# Patient Record
Sex: Female | Born: 1965 | Race: White | Hispanic: No | Marital: Married | Smoking: Never smoker
Health system: Southern US, Community
[De-identification: ages and names within clinical notes are randomized; demographics above are authoritative.]

## PROBLEM LIST (undated history)

## (undated) DIAGNOSIS — F329 Major depressive disorder, single episode, unspecified: Secondary | ICD-10-CM

## (undated) DIAGNOSIS — F419 Anxiety disorder, unspecified: Secondary | ICD-10-CM

## (undated) DIAGNOSIS — O24419 Gestational diabetes mellitus in pregnancy, unspecified control: Secondary | ICD-10-CM

## (undated) DIAGNOSIS — F32A Depression, unspecified: Secondary | ICD-10-CM

## (undated) DIAGNOSIS — L280 Lichen simplex chronicus: Secondary | ICD-10-CM

## (undated) HISTORY — DX: Lichen simplex chronicus: L28.0

## (undated) HISTORY — PX: NO PAST SURGERIES: SHX2092

## (undated) HISTORY — DX: Anxiety disorder, unspecified: F41.9

## (undated) HISTORY — DX: Gestational diabetes mellitus in pregnancy, unspecified control: O24.419

## (undated) HISTORY — DX: Depression, unspecified: F32.A

---
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
## 1998-06-17 ENCOUNTER — Other Ambulatory Visit: Admission: RE | Admit: 1998-06-17 | Discharge: 1998-06-17 | Payer: Self-pay | Admitting: Obstetrics and Gynecology

## 1999-06-23 ENCOUNTER — Other Ambulatory Visit: Admission: RE | Admit: 1999-06-23 | Discharge: 1999-06-23 | Payer: Self-pay | Admitting: Obstetrics and Gynecology

## 2000-01-01 ENCOUNTER — Encounter: Admission: RE | Admit: 2000-01-01 | Discharge: 2000-01-01 | Payer: Self-pay | Admitting: Otolaryngology

## 2000-01-01 ENCOUNTER — Encounter: Payer: Self-pay | Admitting: Obstetrics and Gynecology

## 2000-07-05 ENCOUNTER — Other Ambulatory Visit: Admission: RE | Admit: 2000-07-05 | Discharge: 2000-07-05 | Payer: Self-pay | Admitting: Obstetrics and Gynecology

## 2001-08-08 ENCOUNTER — Other Ambulatory Visit: Admission: RE | Admit: 2001-08-08 | Discharge: 2001-08-08 | Payer: Self-pay | Admitting: Obstetrics and Gynecology

## 2002-08-21 ENCOUNTER — Other Ambulatory Visit: Admission: RE | Admit: 2002-08-21 | Discharge: 2002-08-21 | Payer: Self-pay | Admitting: Obstetrics and Gynecology

## 2003-09-17 ENCOUNTER — Other Ambulatory Visit: Admission: RE | Admit: 2003-09-17 | Discharge: 2003-09-17 | Payer: Self-pay | Admitting: Obstetrics and Gynecology

## 2004-12-09 ENCOUNTER — Other Ambulatory Visit: Admission: RE | Admit: 2004-12-09 | Discharge: 2004-12-09 | Payer: Self-pay | Admitting: Obstetrics and Gynecology

## 2005-11-01 ENCOUNTER — Encounter: Admission: RE | Admit: 2005-11-01 | Discharge: 2005-11-01 | Payer: Self-pay | Admitting: Obstetrics and Gynecology

## 2005-12-15 ENCOUNTER — Other Ambulatory Visit: Admission: RE | Admit: 2005-12-15 | Discharge: 2005-12-15 | Payer: Self-pay | Admitting: Obstetrics and Gynecology

## 2006-11-04 ENCOUNTER — Encounter: Admission: RE | Admit: 2006-11-04 | Discharge: 2006-11-04 | Payer: Self-pay | Admitting: Obstetrics and Gynecology

## 2006-12-07 ENCOUNTER — Other Ambulatory Visit: Admission: RE | Admit: 2006-12-07 | Discharge: 2006-12-07 | Payer: Self-pay | Admitting: Obstetrics and Gynecology

## 2007-11-06 ENCOUNTER — Encounter: Admission: RE | Admit: 2007-11-06 | Discharge: 2007-11-06 | Payer: Self-pay | Admitting: Obstetrics and Gynecology

## 2008-01-29 ENCOUNTER — Other Ambulatory Visit: Admission: RE | Admit: 2008-01-29 | Discharge: 2008-01-29 | Payer: Self-pay | Admitting: Obstetrics and Gynecology

## 2008-11-11 ENCOUNTER — Encounter: Admission: RE | Admit: 2008-11-11 | Discharge: 2008-11-11 | Payer: Self-pay | Admitting: Obstetrics and Gynecology

## 2008-11-15 ENCOUNTER — Encounter: Admission: RE | Admit: 2008-11-15 | Discharge: 2008-11-15 | Payer: Self-pay | Admitting: Obstetrics and Gynecology

## 2009-01-29 ENCOUNTER — Ambulatory Visit: Payer: Self-pay | Admitting: Family Medicine

## 2009-01-29 DIAGNOSIS — R21 Rash and other nonspecific skin eruption: Secondary | ICD-10-CM | POA: Insufficient documentation

## 2009-02-03 ENCOUNTER — Telehealth: Payer: Self-pay | Admitting: Family Medicine

## 2009-09-14 ENCOUNTER — Ambulatory Visit: Payer: Self-pay | Admitting: Occupational Medicine

## 2009-11-17 ENCOUNTER — Encounter: Admission: RE | Admit: 2009-11-17 | Discharge: 2009-11-17 | Payer: Self-pay | Admitting: Obstetrics and Gynecology

## 2010-05-17 ENCOUNTER — Encounter: Payer: Self-pay | Admitting: Obstetrics and Gynecology

## 2010-05-26 NOTE — Assessment & Plan Note (Signed)
Summary: Cough-runny nose - clear, sinus pressure, sorethroat x3 dys rm 3   Vital Signs:  Patient Profile:   45 Years Old Female CC:      Cold & URI symptoms Height:     67 inches Weight:      127 pounds O2 Sat:      99 % O2 treatment:    Room Air Temp:     98.0 degrees F oral Pulse rate:   89 / minute Pulse rhythm:   regular Resp:     16 per minute BP sitting:   101 / 67  (right arm) Cuff size:   regular  Vitals Entered By: Areta Haber CMA (Sep 14, 2009 1:15 PM)                  Current Allergies: ! MACROBID ! BETADINE (POVIDONE-IODINE)      History of Present Illness Chief Complaint: Cold & URI symptoms History of Present Illness: Very pleasant kindergarten teacher.  Presents with a three day history of fatigue and flu like symptoms.   She has had generalized body aches and chills.  Yesterday she developed sore throat.  Throat is so sore that she can't swallow own saliva.  No reports of shortness of breath, stridor, or difficulty breathing.  No reports of sinus congestion or ear pain.   Rapid strep test was positive.   Current Problems: STREP THROAT (ICD-034.0) SKIN RASH (ICD-782.1)   Current Meds YASMIN 28 3-0.03 MG TABS (DROSPIRENONE-ETHINYL ESTRADIOL) Take 1 tab by mouth once daily * B COMPLEX  * CALCIUM  * WOMEN'S MV  * FISH OIL  NYQUIL D COLD/FLU 60-12.09-22-998 MG/30ML LIQD (PSEUDOEPH-DOXYLAMINE-DM-APAP) as directed ADVIL 200 MG TABS (IBUPROFEN) as directed QC SEVERE ALLERGY 12.5-500 MG TABS (DIPHENHYDRAMINE-APAP) as directed LIDOCAINE VISCOUS 2 % SOLN (LIDOCAINE HCL) 15ml by mouth q3 to 4hr as needed.  Swish and spit out.  Max 8 doses/day LORTAB 5 5-500 MG TABS (HYDROCODONE-ACETAMINOPHEN) One or two tabs by mouth hs as needed pain  REVIEW OF SYSTEMS Constitutional Symptoms       Complains of fever.     Denies chills, night sweats, weight loss, weight gain, and fatigue.  Eyes       Denies change in vision, eye pain, eye discharge, glasses,  contact lenses, and eye surgery. Ear/Nose/Throat/Mouth       Complains of frequent runny nose, sinus problems, and sore throat.      Denies hearing loss/aids, change in hearing, ear pain, ear discharge, dizziness, frequent nose bleeds, hoarseness, and tooth pain or bleeding.      Comments: x 3 dys  Respiratory       Complains of dry cough.      Denies productive cough, wheezing, shortness of breath, asthma, bronchitis, and emphysema/COPD.  Cardiovascular       Denies murmurs, chest pain, and tires easily with exhertion.    Gastrointestinal       Denies stomach pain, nausea/vomiting, diarrhea, constipation, blood in bowel movements, and indigestion. Genitourniary       Denies painful urination, kidney stones, and loss of urinary control. Neurological       Complains of headaches.      Denies paralysis, seizures, and fainting/blackouts. Musculoskeletal       Denies muscle pain, joint pain, joint stiffness, decreased range of motion, redness, swelling, muscle weakness, and gout.  Skin       Denies bruising, unusual mles/lumps or sores, and hair/skin or nail changes.  Psych  Denies mood changes, temper/anger issues, anxiety/stress, speech problems, depression, and sleep problems. Other Comments: Pt has not seen PCP for this.   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  Married to [employer] with 2 kids.  Never Smoked Alcohol use-no Drug use-no  Risk Factors: Alcohol Use: 0 (01/29/2009)  Risk Factors: Smoking Status: never (01/29/2009) Physical Exam General appearance: well developed, well nourished, no acute distress Ears: normal, no lesions or deformities Nasal: mucosa pink, nonedematous, no septal deviation, turbinates normal Oral/Pharynx:  pharyngeal erythema without exudate, uvula midline without deviation Neck: supple,anterior lymphadenopathy present Chest/Lungs: no rales, wheezes, or rhonchi bilateral, breath sounds equal without effort Heart: regular rate and  rhythm, no murmur Assessment New Problems: STREP THROAT (ICD-034.0)   Plan New Medications/Changes: LORTAB 5 5-500 MG TABS (HYDROCODONE-ACETAMINOPHEN) One or two tabs by mouth hs as needed pain  #10 (ten) x 0, 09/14/2009, Kathrine Haddock MD LIDOCAINE VISCOUS 2 % SOLN (LIDOCAINE HCL) 15ml by mouth q3 to 4hr as needed.  Swish and spit out.  Max 8 doses/day  #200cc x 0, 09/14/2009, Kathrine Haddock MD  New Orders: New Patient Level III [phone] Bicillin LA 1.2 million units Injection [J0561] Admin of Therapeutic Inj  intramuscular or subcutaneous [96372] Planning Comments:   Gave 1.2 million units Biacillin IM Viscous lidocaine script Vicodin for pain tonight if needed Advised to seek emergency care if she develops difficulty breathing, or stridor.  Rapid strep was positive.   The patient and/or caregiver has been counseled thoroughly with regard to medications prescribed including dosage, schedule, interactions, rationale for use, and possible side effects and they verbalize understanding.  Diagnoses and expected course of recovery discussed and will return if not improved as expected or if the condition worsens. Patient and/or caregiver verbalized understanding.  Prescriptions: LORTAB 5 5-500 MG TABS (HYDROCODONE-ACETAMINOPHEN) One or two tabs by mouth hs as needed pain  #10 (ten) x 0   Entered and Authorized by:   Kathrine Haddock MD   Signed by:   Kathrine Haddock MD on 09/14/2009   Method used:   Print then Give to Patient   RxID:   [id number] LIDOCAINE VISCOUS 2 % SOLN (LIDOCAINE HCL) 15ml by mouth q3 to 4hr as needed.  Swish and spit out.  Max 8 doses/day  #200cc x 0   Entered and Authorized by:   Kathrine Haddock MD   Signed by:   Kathrine Haddock MD on  09/14/2009   Method used:   Electronically to        [facility] [phone]* (retail)       [address]       [place], [place]  [address]       Ph: [phone]       Fax: [phone]   RxID:   [id number]   Laboratory Results  Date/Time Received: Sep 14, 2009 1:43 PM  Date/Time Reported: Sep 14, 2009 1:43 PM   Other Tests  Rapid Strep: positive  Kit Test Internal QC: Positive   (Normal Range: Negative)    Medication Administration  Injection # 1:    Medication: Bicillin LA 1.2 million units Injection    Diagnosis: STREP THROAT (ICD-034.0)    Route: IM  Site: LUOQ gluteus    Exp Date: 04/25/2012    Lot #: [id number]    Mfr: Brooke Dare    Patient tolerated injection without complications    Given by: Areta Haber CMA (Sep 14, 2009 2:01 PM)  Orders Added: 1)  New Patient Level III [99203] 2)  Bicillin LA 1.2 million units Injection [J0561] 3)  Admin of Therapeutic Inj  intramuscular or subcutaneous [96372]   Appended Document: Cough-runny nose - clear, sinus pressure, sorethroat x3 dys rm 3 Rx for Lidocaine 2% Solution was electronically sent to wrong pharmacy, pt. was at [facility], informed pharmacists of RX and CX'd one at [facility]. Pt informed. {Teresa MOntgomery, CMA Sep 14, 2009 5:00pm.

## 2010-10-15 ENCOUNTER — Other Ambulatory Visit: Payer: Self-pay | Admitting: Obstetrics and Gynecology

## 2010-10-15 DIAGNOSIS — Z1231 Encounter for screening mammogram for malignant neoplasm of breast: Secondary | ICD-10-CM

## 2010-11-24 ENCOUNTER — Ambulatory Visit
Admission: RE | Admit: 2010-11-24 | Discharge: 2010-11-24 | Disposition: A | Discharge status: Home / Self Care | Payer: Self-pay | Source: Ambulatory Visit | Attending: Obstetrics and Gynecology | Admitting: Obstetrics and Gynecology

## 2010-11-24 DIAGNOSIS — Z1231 Encounter for screening mammogram for malignant neoplasm of breast: Secondary | ICD-10-CM

## 2011-12-07 ENCOUNTER — Other Ambulatory Visit: Payer: Self-pay | Admitting: Obstetrics and Gynecology

## 2011-12-07 DIAGNOSIS — Z1231 Encounter for screening mammogram for malignant neoplasm of breast: Secondary | ICD-10-CM

## 2011-12-14 ENCOUNTER — Ambulatory Visit (INDEPENDENT_AMBULATORY_CARE_PROVIDER_SITE_OTHER): Payer: PRIVATE HEALTH INSURANCE

## 2011-12-14 DIAGNOSIS — Z1231 Encounter for screening mammogram for malignant neoplasm of breast: Secondary | ICD-10-CM

## 2012-11-27 ENCOUNTER — Other Ambulatory Visit: Payer: Self-pay | Admitting: Obstetrics & Gynecology

## 2012-11-27 DIAGNOSIS — Z1231 Encounter for screening mammogram for malignant neoplasm of breast: Secondary | ICD-10-CM

## 2012-12-07 ENCOUNTER — Ambulatory Visit (INDEPENDENT_AMBULATORY_CARE_PROVIDER_SITE_OTHER): Payer: Self-pay

## 2012-12-07 DIAGNOSIS — Z1231 Encounter for screening mammogram for malignant neoplasm of breast: Secondary | ICD-10-CM

## 2013-02-21 ENCOUNTER — Other Ambulatory Visit: Payer: Self-pay | Admitting: Obstetrics and Gynecology

## 2013-02-21 NOTE — Telephone Encounter (Signed)
eScribe request for refill on JUNEL Last filled - 03/15/12 X 1 YEAR Last AEX - 03/15/12 Next AEX - 03/27/13 RX sent until AEX.

## 2013-03-27 ENCOUNTER — Encounter: Payer: Self-pay | Admitting: Obstetrics & Gynecology

## 2013-03-27 ENCOUNTER — Ambulatory Visit: Payer: Self-pay | Admitting: Obstetrics and Gynecology

## 2013-03-27 ENCOUNTER — Ambulatory Visit (INDEPENDENT_AMBULATORY_CARE_PROVIDER_SITE_OTHER): Payer: Self-pay | Admitting: Obstetrics & Gynecology

## 2013-03-27 VITALS — BP 102/70 | HR 68 | Resp 16 | Ht 65.5 in | Wt 141.2 lb

## 2013-03-27 DIAGNOSIS — Z0000 Encounter for general adult medical examination without abnormal findings: Secondary | ICD-10-CM

## 2013-03-27 DIAGNOSIS — Z124 Encounter for screening for malignant neoplasm of cervix: Secondary | ICD-10-CM

## 2013-03-27 DIAGNOSIS — Z01419 Encounter for gynecological examination (general) (routine) without abnormal findings: Secondary | ICD-10-CM

## 2013-03-27 DIAGNOSIS — G43909 Migraine, unspecified, not intractable, without status migrainosus: Secondary | ICD-10-CM

## 2013-03-27 LAB — POCT URINALYSIS DIPSTICK
Blood, UA: NEGATIVE
Protein, UA: NEGATIVE
Urobilinogen, UA: NEGATIVE
pH, UA: 5

## 2013-03-27 LAB — HEMOGLOBIN, FINGERSTICK: Hemoglobin, fingerstick: 14.1 g/dL (ref 12.0–16.0)

## 2013-03-27 MED ORDER — NORETHINDRONE ACET-ETHINYL EST 1-20 MG-MCG PO TABS: 1.0000 | ORAL_TABLET | Freq: Every day | ORAL | Status: DC

## 2013-03-27 MED ORDER — SUMATRIPTAN-NAPROXEN SODIUM 85-500 MG PO TABS: 1.0000 | ORAL_TABLET | ORAL | Status: DC | PRN

## 2013-03-27 NOTE — Progress Notes (Signed)
47 y.o. G4P2 MarriedCaucasianF here for annual exam.  Went to the ER--[facility]--11/ due to severe headache.  Treated with butalbital/Caffeine/Acetaminophen.  Has never been on a migraine medication.  Does not have headaches frequently.  Would like something on hand if needed.  Had CT of head which was normal and blood work that was normal.  She was in ER for "hours".  Doesn't feel like the RX given totally helps.  Would like another suggestion.  Has friend that uses Treximet.  Never tried and ergotamine in past.  Does not feel these are menstrual related at all.  For her, they are very stress related.  Patient's last menstrual period was 03/09/2013.          Sexually active: yes  The current method of family planning is OCP (estrogen/progesterone).    Exercising: yes  walking Smoker:  no  Health Maintenance: Pap:  02/20/10 WNL History of abnormal Pap:  yes MMG:  12/07/12 normal Colonoscopy:  none BMD:   none TDaP:  10/11 Screening Labs: cholesterol 2011, Hb today: 14.1, Urine today: WBC-1+   reports that she has never smoked. She has never used smokeless tobacco. She reports that she does not drink alcohol or use illicit drugs.  Past Medical History  Diagnosis Date  . Lichen simplex chronicus   . Gestational diabetes     History reviewed. No pertinent past surgical history.  Current Outpatient Prescriptions  Medication Sig Dispense Refill  . Ascorbic Acid (VITAMIN C PO) Take by mouth daily.      . Calcium Carbonate (CALCIUM 600 PO) Take by mouth daily.      . Cyanocobalamin (VITAMIN B 12 PO) Take 1,000 mg by mouth daily.      Colleen Can 1/20 1-20 MG-MCG tablet TAKE 1 TABLET EVERY DAY  21 tablet  1  . loratadine (CLARITIN) 10 MG tablet Take 10 mg by mouth daily as needed for allergies.      . Magnesium 250 MG TABS Take by mouth daily.      . Multiple Vitamins-Minerals (MULTIVITAMIN PO) Take by mouth daily.      . Omega-3 Fatty Acids (FISH OIL PO) Take by mouth daily.        No current facility-administered medications for this visit.    Family History  Problem Relation Age of Onset  . Deep vein thrombosis Mother     on HRT  . Breast cancer Maternal Grandmother     post menopausal    ROS:  Pertinent items are noted in HPI.  Otherwise, a comprehensive ROS was negative.  Exam:   BP 102/70  Pulse 68  Resp 16  Ht 5' 5.5" (1.664 m)  Wt 141 lb 3.2 oz (64.048 kg)  BMI 23.13 kg/m2  LMP 03/09/2013  Weight change: +3lbs  Height: 5' 5.5" (166.4 cm)  Ht Readings from Last 3 Encounters:  03/27/13 5' 5.5" (1.664 m)  01/29/09 5\' 7"  (1.702 m)    General appearance: alert, cooperative and appears stated age Head: Normocephalic, without obvious abnormality, atraumatic Neck: no adenopathy, supple, symmetrical, trachea midline and thyroid normal to inspection and palpation Lungs: clear to auscultation bilaterally Breasts: normal appearance, no masses or tenderness Heart: regular rate and rhythm Abdomen: soft, non-tender; bowel sounds normal; no masses,  no organomegaly Extremities: extremities normal, atraumatic, no cyanosis or edema Skin: Skin color, texture, turgor normal. No rashes or lesions Lymph nodes: Cervical, supraclavicular, and axillary nodes normal. No abnormal inguinal nodes palpated Neurologic: Grossly normal   Pelvic:  External genitalia:  no lesions              Urethra:  normal appearing urethra with no masses, tenderness or lesions              Bartholins and Skenes: normal                 Vagina: normal appearing vagina with normal color and discharge, no lesions              Cervix: no lesions              Pap taken: yes Bimanual Exam:  Uterus:  normal size, contour, position, consistency, mobility, non-tender              Adnexa: normal adnexa and no mass, fullness, tenderness               Rectovaginal: Confirms               Anus:  normal sphincter tone, no lesions  A:  Well Woman with normal exam Migraines On OCPs  P:    Mammogram yearly pap smear with + HR HPV testing obtained today Rx for LoOvral to pharmacy for yr. Trial of Treximet 1 tab with HA onset, can repeat in 2 hrs.  Pt aware I can also prescribed Imitrex and Naprosyn, if cheaper for her.  She will call pharmacy and see about cost and let me know. return annually or prn  An After Visit Summary was printed and given to the patient.

## 2013-03-27 NOTE — Patient Instructions (Signed)

## 2013-03-28 ENCOUNTER — Telehealth: Payer: Self-pay | Admitting: Obstetrics & Gynecology

## 2013-03-28 NOTE — Addendum Note (Signed)
Addended by: Jerene Bears on: 03/28/2013 06:14 AM   Modules accepted: Orders

## 2013-03-28 NOTE — Telephone Encounter (Signed)
Patient went to pick medication listed below she doesn't have insurance it is going to cost her over $600 for 9 pills. She said miller told her to call if the price was out of her reach and she would rewrite the script for the generic brand   SUMAtriptan-naproxen (TREXIMET) 85-500 MG per tablet  Take 1 tablet by mouth every 2 (two) hours as needed for migraine., Starting 03/27/2013, Until Discontinued, Normal, Last Dose: Not Recorded  Refills: 6 ordered Pharmacy: [id number] - [place], [place] - [address]

## 2013-03-29 MED ORDER — NAPROXEN 500 MG PO TABS: 500.0000 mg | ORAL_TABLET | Freq: Two times a day (BID) | ORAL | Status: DC

## 2013-03-29 MED ORDER — SUMATRIPTAN SUCCINATE 100 MG PO TABS: 100.0000 mg | ORAL_TABLET | ORAL | Status: DC | PRN

## 2013-03-29 NOTE — Telephone Encounter (Signed)
Order done for imitrex 100mg  and naprosyn 500mg .  treximet is 85mg  and 500mg .  So with headache onset, she needs to take both and can repeat both in 2 hours.

## 2013-03-29 NOTE — Telephone Encounter (Signed)
Routing to Dr. Hyacinth Meeker for medication change.

## 2013-03-30 ENCOUNTER — Other Ambulatory Visit: Payer: Self-pay | Admitting: Obstetrics & Gynecology

## 2013-03-30 NOTE — Telephone Encounter (Signed)
Refills x 1 year were sent on 03/27/13. RX denied.

## 2013-03-30 NOTE — Telephone Encounter (Signed)
Message left to return call to [place] at [phone].   Imitrex called to pharmacy.  Routing to [place] just for fyi that I called immitrex to pharmacy.

## 2013-11-02 ENCOUNTER — Other Ambulatory Visit: Payer: Self-pay | Admitting: Obstetrics & Gynecology

## 2013-11-02 DIAGNOSIS — Z139 Encounter for screening, unspecified: Secondary | ICD-10-CM

## 2013-11-20 ENCOUNTER — Ambulatory Visit (INDEPENDENT_AMBULATORY_CARE_PROVIDER_SITE_OTHER): Payer: Self-pay

## 2013-11-20 DIAGNOSIS — Z139 Encounter for screening, unspecified: Secondary | ICD-10-CM

## 2013-11-20 DIAGNOSIS — Z1231 Encounter for screening mammogram for malignant neoplasm of breast: Secondary | ICD-10-CM

## 2014-02-15 ENCOUNTER — Emergency Department
Admission: EM | Admit: 2014-02-15 | Discharge: 2014-02-15 | Disposition: A | Discharge status: Home / Self Care | Payer: Self-pay | Source: Home / Self Care | Attending: Emergency Medicine | Admitting: Emergency Medicine

## 2014-02-15 ENCOUNTER — Encounter: Payer: Self-pay | Admitting: Emergency Medicine

## 2014-02-15 DIAGNOSIS — J020 Streptococcal pharyngitis: Secondary | ICD-10-CM

## 2014-02-15 LAB — POCT RAPID STREP A (OFFICE): Rapid Strep A Screen: NEGATIVE

## 2014-02-15 MED ORDER — POLYMYXIN B-TRIMETHOPRIM 10000-0.1 UNIT/ML-% OP SOLN
1.0000 [drp] | Freq: Four times a day (QID) | OPHTHALMIC | Status: DC
Start: 2014-02-15 — End: 2014-03-01

## 2014-02-15 MED ORDER — METHYLPREDNISOLONE ACETATE 80 MG/ML IJ SUSP: 80.0000 mg | Freq: Once | INTRAMUSCULAR | Status: DC

## 2014-02-15 MED ORDER — AMOXICILLIN 875 MG PO TABS: 875.0000 mg | ORAL_TABLET | Freq: Two times a day (BID) | ORAL | Status: DC

## 2014-02-15 MED ORDER — PREDNISONE (PAK) 10 MG PO TABS: ORAL_TABLET | Freq: Every day | ORAL | Status: DC

## 2014-02-15 NOTE — ED Provider Notes (Signed)
CSN: [id number]     Arrival date & time 02/15/14  1530 History   First MD Initiated Contact with Patient 02/15/14 1539     Chief Complaint  Patient presents with  . Sore Throat   (Consider location/radiation/quality/duration/timing/severity/associated sxs/prior Treatment) HPI Vanessa Chase is a 48 y.o. female who complains of onset of sore throat for 10 days.  The symptoms are constant and mild-moderate in severity.  She was diagnosed with a positive strep test about a week ago and took penicillin.  She is not feeling any better.  Also took viscous lidocaine.  She did not change her toothbrush.  She's also been experiencing left eye redness for the last 1 day.  Also some sinus pressure.  She is a Manufacturing systems engineerpreschool teacher.     Past Medical History  Diagnosis Date  . Lichen simplex chronicus   . Gestational diabetes    History reviewed. No pertinent past surgical history. Family History  Problem Relation Age of Onset  . Deep vein thrombosis Mother     on HRT  . Breast cancer Maternal Grandmother     post menopausal   History  Substance Use Topics  . Smoking status: Never Smoker   . Smokeless tobacco: Never Used  . Alcohol Use: No   OB History   Grav Para Term Preterm Abortions TAB SAB Ect Mult Living   4 2        2      Review of Systems  All other systems reviewed and are negative.   Allergies  Betadine; Nitrofurantoin; and Povidone-iodine  Home Medications   Prior to Admission medications   Medication Sig Start Date End Date Taking? Authorizing Provider  penicillin v potassium (VEETID) 500 MG tablet Take 500 mg by mouth 4 (four) times daily.   Yes Historical Provider, MD  amoxicillin (AMOXIL) 875 MG tablet Take 1 tablet (875 mg total) by mouth 2 (two) times daily. 02/15/14   Marlaine HindJeffrey H Alfonzo Arca, MD  Ascorbic Acid (VITAMIN C PO) Take by mouth daily.    Historical Provider, MD  Calcium Carbonate (CALCIUM 600 PO) Take by mouth daily.    Historical Provider, MD  Cyanocobalamin  (VITAMIN B 12 PO) Take 1,000 mg by mouth daily.    Historical Provider, MD  loratadine (CLARITIN) 10 MG tablet Take 10 mg by mouth daily as needed for allergies.    Historical Provider, MD  Magnesium 250 MG TABS Take by mouth daily.    Historical Provider, MD  Multiple Vitamins-Minerals (MULTIVITAMIN PO) Take by mouth daily.    Historical Provider, MD  naproxen (NAPROSYN) 500 MG tablet Take 1 tablet (500 mg total) by mouth 2 (two) times daily with a meal. 03/29/13   Annamaria BootsMary Suzanne Miller, MD  norethindrone-ethinyl estradiol (JUNEL 1/20) 1-20 MG-MCG tablet Take 1 tablet by mouth daily. 03/27/13   Annamaria BootsMary Suzanne Miller, MD  Omega-3 Fatty Acids (FISH OIL PO) Take by mouth daily.    Historical Provider, MD  predniSONE (STERAPRED UNI-PAK) 10 MG tablet Take by mouth daily. 6 day pack, use as directed 02/15/14   Marlaine HindJeffrey H Tennelle Taflinger, MD  SUMAtriptan (IMITREX) 100 MG tablet Take 1 tablet (100 mg total) by mouth every 2 (two) hours as needed for migraine or headache. May repeat in 2 hours if headache persists or recurs. 03/29/13   Annamaria BootsMary Suzanne Miller, MD  SUMAtriptan-naproxen (TREXIMET) 85-500 MG per tablet Take 1 tablet by mouth every 2 (two) hours as needed for migraine. 03/27/13   Annamaria BootsMary Suzanne Miller, MD  trimethoprim-polymyxin b (POLYTRIM)  ophthalmic solution Place 1 drop into the left eye every 6 (six) hours. 02/15/14   Marlaine HindJeffrey H Anandi Abramo, MD   BP 134/79  Pulse 92  Temp(Src) 98.3 F (36.8 C) (Oral)  Ht 5\' 7"  (1.702 m)  Wt 146 lb (66.225 kg)  BMI 22.86 kg/m2  SpO2 99%  LMP 02/01/2014 Physical Exam  Nursing note and vitals reviewed. Constitutional: She is oriented to person, place, and time. She appears well-developed and well-nourished.  HENT:  Head: Normocephalic and atraumatic.  Right Ear: Tympanic membrane, external ear and ear canal normal.  Left Ear: Tympanic membrane, external ear and ear canal normal.  Nose: Nose normal.  Mouth/Throat: Posterior oropharyngeal erythema present. No oropharyngeal  exudate or posterior oropharyngeal edema.  Eyes: EOM are normal. Pupils are equal, round, and reactive to light. Right eye exhibits no discharge. Left eye exhibits no discharge. Right conjunctiva is not injected. Left conjunctiva is injected. No scleral icterus.  Neck: Neck supple.  Cardiovascular: Regular rhythm and normal heart sounds.   Pulmonary/Chest: Effort normal and breath sounds normal. No respiratory distress.  Neurological: She is alert and oriented to person, place, and time.  Skin: Skin is warm and dry.  Psychiatric: She has a normal mood and affect. Her speech is normal.    ED Course  Procedures (including critical care time) Labs Review Labs Reviewed  STREP A DNA PROBE  POCT RAPID STREP A (OFFICE)    Imaging Review No results found.   MDM   1. Streptococcal sore throat    1)  Take the prescribed antibiotic as instructed.  Rapid strep today is negative.  Cultures pending.  Prescription given for amoxicillin.  Also for some prednisone in a shot of Depo-Medrol was given as well.  Needs to make sure that she change the toothbrush to prevent reinfection. 2)  Use nasal saline solution (over the counter) at least 3 times a day. 3)  Use over the counter decongestants like Zyrtec-D every 12 hours as needed to help with congestion.  If you have hypertension, do not take medicines with sudafed.  4)  Can take tylenol every 6 hours or motrin every 8 hours for pain or fever. 5)  Follow up with your primary doctor if no improvement in 5-7 days, sooner if increasing pain, fever, or new symptoms.     Patient also likely with left eye conjunctivitis.  Polytrim prescription given.  Wash hands and keep clean  Marlaine HindJeffrey H Yoan Sallade, MD 02/15/14 1600

## 2014-02-15 NOTE — ED Notes (Signed)
Strep throat x 1 week ago taking 500mg  penicillin not getting better

## 2014-02-16 LAB — STREP A DNA PROBE: GASP: NEGATIVE

## 2014-02-17 ENCOUNTER — Telehealth: Payer: Self-pay | Admitting: Emergency Medicine

## 2014-02-25 ENCOUNTER — Encounter: Payer: Self-pay | Admitting: Emergency Medicine

## 2014-02-25 ENCOUNTER — Ambulatory Visit: Payer: Self-pay | Admitting: Obstetrics & Gynecology

## 2014-03-01 ENCOUNTER — Encounter: Payer: Self-pay | Admitting: Certified Nurse Midwife

## 2014-03-01 ENCOUNTER — Ambulatory Visit (INDEPENDENT_AMBULATORY_CARE_PROVIDER_SITE_OTHER): Payer: Self-pay | Admitting: Certified Nurse Midwife

## 2014-03-01 VITALS — BP 110/68 | HR 68 | Resp 16 | Ht 65.25 in | Wt 145.0 lb

## 2014-03-01 DIAGNOSIS — Z0000 Encounter for general adult medical examination without abnormal findings: Secondary | ICD-10-CM

## 2014-03-01 DIAGNOSIS — Z01419 Encounter for gynecological examination (general) (routine) without abnormal findings: Secondary | ICD-10-CM

## 2014-03-01 DIAGNOSIS — Z30018 Encounter for initial prescription of other contraceptives: Secondary | ICD-10-CM

## 2014-03-01 MED ORDER — NORETHIN-ETH ESTRAD-FE BIPHAS 1 MG-10 MCG / 10 MCG PO TABS: 1.0000 | ORAL_TABLET | Freq: Every day | ORAL | Status: DC

## 2014-03-01 NOTE — Progress Notes (Signed)
48 y.o. G4P2 Married Caucasian Fe here for annual exam. Periods are changing, last 3 were very light with only mini pad.  Continues on OCP and is aware of decrease in OCP use. Denies hot flashes or night sweats or vaginal dryness. Sees PCP prn. No health issues today.  Patient's last menstrual period was 01/29/2014.          Sexually active: Yes.    The current method of family planning is OCP (estrogen/progesterone).    Exercising: Yes.    walking Smoker:  no  Health Maintenance: Pap: 03-28-13 neg HPV HR neg MMG:  11-20-13 density category d, birads category 1:neg discussed need 3 d mammogram yearly Colonoscopy:  none BMD:   none TDaP:  2011 Labs: none Self breast exam: done occ   reports that she has never smoked. She has never used smokeless tobacco. She reports that she does not drink alcohol or use illicit drugs.  Past Medical History  Diagnosis Date  . Lichen simplex chronicus   . Gestational diabetes     History reviewed. No pertinent past surgical history.  Current Outpatient Prescriptions  Medication Sig Dispense Refill  . Ascorbic Acid (VITAMIN C) 1000 MG tablet Take 1,000 mg by mouth daily.    . Calcium Carbonate (CALCIUM 600 PO) Take by mouth daily.    Marland Kitchen. CRANBERRY PO Take 500 mg by mouth daily.    . Cyanocobalamin (VITAMIN B 12 PO) Take 1,000 mg by mouth daily.    . Lactobacillus (REPHRESH PRO-B PO) Take by mouth daily.    Marland Kitchen. loratadine (CLARITIN) 10 MG tablet Take 10 mg by mouth daily as needed for allergies.    . Magnesium 250 MG TABS Take by mouth daily.    . Multiple Vitamins-Minerals (MULTIVITAMIN PO) Take by mouth daily.    . norethindrone-ethinyl estradiol (JUNEL 1/20) 1-20 MG-MCG tablet Take 1 tablet by mouth daily. 1 Package 13  . Omega-3 Fatty Acids (FISH OIL PO) Take by mouth daily.    Marland Kitchen. VITAMIN D, CHOLECALCIFEROL, PO Take 5,000 Int'l Units by mouth daily.     No current facility-administered medications for this visit.    Family History  Problem  Relation Age of Onset  . Deep vein thrombosis Mother     on HRT  . Breast cancer Maternal Grandmother     post menopausal    ROS:  Pertinent items are noted in HPI.  Otherwise, a comprehensive ROS was negative.  Exam:   BP 110/68 mmHg  Pulse 68  Resp 16  Ht 5' 5.25" (1.657 m)  Wt 145 lb (65.772 kg)  BMI 23.95 kg/m2  LMP 01/29/2014 Height: 5' 5.25" (165.7 cm)  Ht Readings from Last 3 Encounters:  03/01/14 5' 5.25" (1.657 m)  02/15/14 5\' 7"  (1.702 m)  03/27/13 5' 5.5" (1.664 m)    General appearance: alert, cooperative and appears stated age Head: Normocephalic, without obvious abnormality, atraumatic Neck: no adenopathy, supple, symmetrical, trachea midline and thyroid normal to inspection and palpation Lungs: clear to auscultation bilaterally Breasts: normal appearance, no masses or tenderness, No nipple retraction or dimpling, No nipple discharge or bleeding, No axillary or supraclavicular adenopathy Heart: regular rate and rhythm Abdomen: soft, non-tender; no masses,  no organomegaly Extremities: extremities normal, atraumatic, no cyanosis or edema Skin: Skin color, texture, turgor normal. No rashes or lesions Lymph nodes: Cervical, supraclavicular, and axillary nodes normal. No abnormal inguinal nodes palpated Neurologic: Grossly normal   Pelvic: External genitalia:  no lesions  Urethra:  normal appearing urethra with no masses, tenderness or lesions              Bartholin's and Skene's: normal                 Vagina: normal appearing vagina with normal color and discharge, no lesions              Cervix: normal, non tender, no lesions              Pap taken: No. Bimanual Exam:  Uterus:  normal size, contour, position, consistency, mobility, non-tender and anteverted              Adnexa: normal adnexa and no mass, fullness, tenderness               Rectovaginal: Confirms               Anus:  normal sphincter tone, no lesions  A:  Well Woman with normal  exam  Contraception desires OCP  ? Perimenopausal with cycle changes  Schedule fasting labs    P:   Reviewed health and wellness pertinent to exam  Discussed decrease in estrogen in pills due to age, patient aware and ready to change to lower dosage. Aware she needs BUM during first month of change.  Rx Lo loestrin Fe see order with instructions  Discussed perimenopausal changes and expectations with OCP use. Questions addressed. Will keep menses calendar and advise if changes.  Labs: CMP,Lipid panel, TSH, Vitamin D,CBC  Pap smear  not taken today   counseled on breast self exam, mammography screening, adequate intake of calcium and vitamin D, diet and exercise  return annually or prn  An After Visit Summary was printed and given to the patient.

## 2014-03-01 NOTE — Patient Instructions (Signed)

## 2014-03-04 ENCOUNTER — Encounter: Payer: Self-pay | Admitting: Certified Nurse Midwife

## 2014-03-05 NOTE — Progress Notes (Signed)
Reviewed personally.  M. Suzanne Bodey Frizell, MD.  

## 2014-03-06 NOTE — Telephone Encounter (Signed)
Spoke with patient. Patient states that she was seen with Verner Choleborah S. Leonard CNM on 11/6 and Debbi recommended that she switch to a lower dose of birth control pill. Patient was switched to Lo Loestrin fe. Patient went to pick up rx from the pharmacy and states that it was too expensive. Patient would like to know if there is something similar to this medication that could be called in or if she could switch back to Junel as "that was reasonably priced for me." Advised patient would send a message over to Verner Choleborah S. Leonard CNM and return call with further recommendations. Patient is agreeeable.

## 2014-03-06 NOTE — Telephone Encounter (Signed)
Pt is requesting a different medication because the new medication is very expensive and she has no insurance. [facility] [phone] Pt was on Junel but does not know what the new medication is.

## 2014-03-07 NOTE — Telephone Encounter (Signed)
We can offer her a coupon to use. There is not a generic available, she may want to try another pharmacy. She needs to be on lower dosage due to age 48

## 2014-03-12 NOTE — Telephone Encounter (Signed)
[facility]. Provided savings card information. Pharmacy states that they are unable to run savings card as patient does not currently have insurance on file. Patient currently does not have insurance so she will not be able to use savings card. Patient states that the cost of the medication per month is too expensive every month. Please advise.

## 2014-03-12 NOTE — Telephone Encounter (Signed)
Has she tried another pharmacy

## 2014-03-13 MED ORDER — NORETHINDRONE ACET-ETHINYL EST 1-20 MG-MCG PO TABS: 1.0000 | ORAL_TABLET | Freq: Every day | ORAL | Status: DC

## 2014-03-13 NOTE — Telephone Encounter (Signed)
Spoke with patient. Advised patient rx for Junel sent to pharmacy with refills until next aex. Advised will need to let us know of any health changes. Patient is agreeable and very grateful for refill.   Routing to provider for final review. Patient agreeable to disposition. Will close encounter

## 2014-03-13 NOTE — Addendum Note (Signed)
Addended by: Michele McalpineHINES, KAITLYN E on: 03/13/2014 12:46 PM   Modules accepted: Orders, Medications

## 2014-03-13 NOTE — Telephone Encounter (Signed)
Spoke with patient. Patient states "I do not want to switch pharmacies. All my other medications are there. When I went to get this prescription it was $327 dollars. I just want to stay on the Junel. We were just talking about switching me because my periods were coming less. I really just want to stay where I am at and keep taking the Junel. If I need to talk to Debbi I will but this is really what I want to do." Advised patient will send a message to Verner Choleborah S. Leonard CNM to let her know but Verner Choleborah S. Leonard CNM recommends her to go on a lower dose due to age. " If it is not gong to harm me I want to stay on the Junel." Advised patient will speak with Verner Choleborah S. Leonard CNM and return call. Patient is agreeable.

## 2014-03-13 NOTE — Telephone Encounter (Signed)
Ok to fill but will need to advise if any health changes. She will not be able to fill this dosage at her next aex, so will need to be prepared for change.

## 2014-04-15 ENCOUNTER — Other Ambulatory Visit (INDEPENDENT_AMBULATORY_CARE_PROVIDER_SITE_OTHER): Payer: Self-pay

## 2014-04-15 DIAGNOSIS — Z0000 Encounter for general adult medical examination without abnormal findings: Secondary | ICD-10-CM

## 2014-04-15 LAB — COMPREHENSIVE METABOLIC PANEL
ALT: 11 U/L (ref 0–35)
AST: 14 U/L (ref 0–37)
Albumin: 4 g/dL (ref 3.5–5.2)
Alkaline Phosphatase: 51 U/L (ref 39–117)
BUN: 11 mg/dL (ref 6–23)
CO2: 26 mEq/L (ref 19–32)
Calcium: 9.1 mg/dL (ref 8.4–10.5)
Chloride: 105 mEq/L (ref 96–112)
Creat: 0.62 mg/dL (ref 0.50–1.10)
Glucose, Bld: 94 mg/dL (ref 70–99)
Potassium: 4.7 mEq/L (ref 3.5–5.3)
Sodium: 142 mEq/L (ref 135–145)
Total Bilirubin: 1 mg/dL (ref 0.2–1.2)
Total Protein: 6.5 g/dL (ref 6.0–8.3)

## 2014-04-15 LAB — CBC
HCT: 41.7 % (ref 36.0–46.0)
Hemoglobin: 13.9 g/dL (ref 12.0–15.0)
MCH: 30.4 pg (ref 26.0–34.0)
MCHC: 33.3 g/dL (ref 30.0–36.0)
MCV: 91.2 fL (ref 78.0–100.0)
MPV: 12 fL (ref 9.4–12.4)
Platelets: 194 10*3/uL (ref 150–400)
RBC: 4.57 MIL/uL (ref 3.87–5.11)
RDW: 13.2 % (ref 11.5–15.5)
WBC: 4.4 10*3/uL (ref 4.0–10.5)

## 2014-04-15 LAB — LIPID PANEL
Cholesterol: 199 mg/dL (ref 0–200)
HDL: 65 mg/dL (ref >39)
LDL Cholesterol: 124 mg/dL — ABNORMAL HIGH (ref 0–99)
Total CHOL/HDL Ratio: 3.1 Ratio
Triglycerides: 51 mg/dL (ref <150)
VLDL: 10 mg/dL (ref 0–40)

## 2014-04-16 LAB — TSH: TSH: 1.605 u[IU]/mL (ref 0.350–4.500)

## 2014-04-16 LAB — VITAMIN D 25 HYDROXY (VIT D DEFICIENCY, FRACTURES): Vit D, 25-Hydroxy: 58 ng/mL (ref 30–100)

## 2014-10-14 ENCOUNTER — Other Ambulatory Visit: Payer: Self-pay | Admitting: Obstetrics & Gynecology

## 2014-10-14 DIAGNOSIS — Z1231 Encounter for screening mammogram for malignant neoplasm of breast: Secondary | ICD-10-CM

## 2014-11-21 ENCOUNTER — Ambulatory Visit (INDEPENDENT_AMBULATORY_CARE_PROVIDER_SITE_OTHER): Payer: 59

## 2014-11-21 DIAGNOSIS — Z1231 Encounter for screening mammogram for malignant neoplasm of breast: Secondary | ICD-10-CM

## 2015-02-25 ENCOUNTER — Other Ambulatory Visit: Payer: Self-pay | Admitting: Certified Nurse Midwife

## 2015-02-25 NOTE — Telephone Encounter (Signed)
Medication refill request: Junel Last AEX:  03-01-14 Next AEX: 04-15-15 Last MMG (if hormonal medication request): 11-22-14 WNL Refill authorized: please advise

## 2015-04-15 ENCOUNTER — Ambulatory Visit (INDEPENDENT_AMBULATORY_CARE_PROVIDER_SITE_OTHER): Payer: 59 | Admitting: Certified Nurse Midwife

## 2015-04-15 ENCOUNTER — Ambulatory Visit: Payer: Self-pay | Admitting: Certified Nurse Midwife

## 2015-04-15 ENCOUNTER — Encounter: Payer: Self-pay | Admitting: Certified Nurse Midwife

## 2015-04-15 VITALS — BP 104/68 | HR 70 | Resp 16 | Ht 65.5 in | Wt 147.0 lb

## 2015-04-15 DIAGNOSIS — Z01419 Encounter for gynecological examination (general) (routine) without abnormal findings: Secondary | ICD-10-CM | POA: Diagnosis not present

## 2015-04-15 DIAGNOSIS — Z124 Encounter for screening for malignant neoplasm of cervix: Secondary | ICD-10-CM | POA: Diagnosis not present

## 2015-04-15 DIAGNOSIS — R319 Hematuria, unspecified: Secondary | ICD-10-CM | POA: Diagnosis not present

## 2015-04-15 DIAGNOSIS — Z0000 Encounter for general adult medical examination without abnormal findings: Secondary | ICD-10-CM

## 2015-04-15 DIAGNOSIS — Z3041 Encounter for surveillance of contraceptive pills: Secondary | ICD-10-CM

## 2015-04-15 LAB — POCT URINALYSIS DIPSTICK
BILIRUBIN UA: NEGATIVE
Glucose, UA: NEGATIVE
KETONES UA: NEGATIVE
Nitrite, UA: NEGATIVE
PH UA: 5
Protein, UA: NEGATIVE
Urobilinogen, UA: NEGATIVE

## 2015-04-15 MED ORDER — NORETHINDRONE ACET-ETHINYL EST 1-20 MG-MCG PO TABS: 1.0000 | ORAL_TABLET | Freq: Every day | ORAL | Status: DC

## 2015-04-15 MED ORDER — NORETHIN-ETH ESTRAD-FE BIPHAS 1 MG-10 MCG / 10 MCG PO TABS: 1.0000 | ORAL_TABLET | Freq: Every day | ORAL | Status: DC

## 2015-04-15 NOTE — Patient Instructions (Signed)

## 2015-04-15 NOTE — Progress Notes (Signed)
49 y.o. G4P2 Married  Caucasian Fe here for annual exam. Periods normal, no issues other than being lighter.. Denies any urinary frequency, urgency or pain with urination. Had not had very much to drink. Denies vaginal bleeding or dryness. Sees PCP as needed. Contraception OCP working well, desires continuance. No health problems today.    Patient's last menstrual period was 03/28/2015.          Sexually active: Yes.    The current method of family planning is OCP (estrogen/progesterone).    Exercising: Yes.    walking Smoker:  no  Health Maintenance: Pap:  03-28-13 neg HPV HR neg MMG: 11-21-14 category d density,birads 1:neg Colonoscopy:  none BMD:   none TDaP:  2011 Shingles: none Pneumonia: none Hep C and HIV: not done Labs: poct urine-poct urine-wbc 2+, rbc tr Self breast exam: done occ   reports that she has never smoked. She has never used smokeless tobacco. She reports that she does not drink alcohol or use illicit drugs.  Past Medical History  Diagnosis Date  . Lichen simplex chronicus   . Gestational diabetes     History reviewed. No pertinent past surgical history.  Current Outpatient Prescriptions  Medication Sig Dispense Refill  . Ascorbic Acid (VITAMIN C) 1000 MG tablet Take 1,000 mg by mouth daily.    . Calcium Carbonate (CALCIUM 600 PO) Take by mouth daily.    Marland Kitchen. CRANBERRY PO Take 500 mg by mouth daily.    . Cyanocobalamin (VITAMIN B 12 PO) Take 1,000 mg by mouth daily.    Colleen Can. JUNEL 1/20 1-20 MG-MCG tablet TAKE 1 TABLET BY MOUTH DAILY. 21 tablet 2  . Lactobacillus (REPHRESH PRO-B PO) Take by mouth daily.    Marland Kitchen. loratadine (CLARITIN) 10 MG tablet Take 10 mg by mouth daily as needed for allergies.    . Magnesium 250 MG TABS Take by mouth as needed.     . Multiple Vitamins-Minerals (MULTIVITAMIN PO) Take by mouth daily.    . Omega-3 Fatty Acids (FISH OIL PO) Take by mouth daily.    Marland Kitchen. VITAMIN D, CHOLECALCIFEROL, PO Take 5,000 Int'l Units by mouth daily.     No current  facility-administered medications for this visit.    Family History  Problem Relation Age of Onset  . Deep vein thrombosis Mother     on HRT  . Breast cancer Maternal Grandmother     post menopausal  . Diabetes Father   . Parkinson's disease Father     ROS:  Pertinent items are noted in HPI.  Otherwise, a comprehensive ROS was negative.  Exam:   BP 104/68 mmHg  Pulse 70  Resp 16  Ht 5' 5.5" (1.664 m)  Wt 147 lb (66.679 kg)  BMI 24.08 kg/m2  LMP 03/28/2015 Height: 5' 5.5" (166.4 cm) Ht Readings from Last 3 Encounters:  04/15/15 5' 5.5" (1.664 m)  03/01/14 5' 5.25" (1.657 m)  02/15/14 5\' 7"  (1.702 m)    General appearance: alert, cooperative and appears stated age Head: Normocephalic, without obvious abnormality, atraumatic Neck: no adenopathy, supple, symmetrical, trachea midline and thyroid normal to inspection and palpation Lungs: clear to auscultation bilaterally CVAT bilateral non tender Breasts: normal appearance, no masses or tenderness, No nipple retraction or dimpling, No nipple discharge or bleeding, No axillary or supraclavicular adenopathy Heart: regular rate and rhythm Abdomen: soft, non-tender; no masses,  no organomegaly, negative suprapubic Extremities: extremities normal, atraumatic, no cyanosis or edema Skin: Skin color, texture, turgor normal. No rashes or lesions, warm  and dry Lymph nodes: Cervical, supraclavicular, and axillary nodes normal. No abnormal inguinal nodes palpated Neurologic: Grossly normal   Pelvic: External genitalia:  no lesions, normal female              Urethra:  normal appearing urethra with no masses, tenderness or lesions  Bladder, urethral meatus non tender              Bartholin's and Skene's: normal                 Vagina: normal appearing vagina with normal color and discharge, no lesions              Cervix: normal,no lesions or tenderness, bleeding with pap only              Pap taken: Yes.   Bimanual Exam:  Uterus:   normal size, contour, position, consistency, mobility, non-tender              Adnexa: normal adnexa and no mass, fullness, tenderness               Rectovaginal: Confirms               Anus:  normal sphincter tone, no lesions  Chaperone present: yes  A:  Well Woman with normal exam  Contraception desires OCP  R/O UTI, hematuria    P:   Reviewed health and wellness pertinent to exam  Discussed need to decrease OCP and wean off by age 82, due to increase of higher dose estrogen at this age. Patient agreeable and may be interested in HRT when needed. Will draw FSH and AMH on next aex if at end of iron pills to assess menopause status.  Rx Loloestrin Fe see order with instructions  Reviewed warning signs of UTI and need to advise if occurs. Increase water intake.  Lab: Urine micro  Pap smear as above taken with HPV reflex   Reviewed need for mammogram yearly and SBE, good diet with calcium and Vitamin D, exercise daily.  return annually or prn  An After Visit Summary was printed and given to the patient.

## 2015-04-15 NOTE — Progress Notes (Signed)
Reviewed personally.  M. Suzanne Avice Funchess, MD.  

## 2015-04-16 LAB — URINALYSIS, MICROSCOPIC ONLY
CRYSTALS: NONE SEEN [HPF]
Casts: NONE SEEN [LPF]
YEAST: NONE SEEN [HPF]

## 2015-04-17 ENCOUNTER — Telehealth: Payer: Self-pay | Admitting: Certified Nurse Midwife

## 2015-04-17 DIAGNOSIS — Z3041 Encounter for surveillance of contraceptive pills: Secondary | ICD-10-CM

## 2015-04-17 LAB — IPS PAP TEST WITH REFLEX TO HPV

## 2015-04-17 NOTE — Telephone Encounter (Signed)
Left message to call Jenilee Franey at [phone].  Patient was prescribed Lo Loestrin Fe on 04/15/2015. Patient was previously on Junel 1/20, but was advised needed to decrease estrogen to wean off within the next year. This is the lowest dose of OCP and does not come in a generic. Patient can use online savings card to reduce cost.

## 2015-04-17 NOTE — Telephone Encounter (Signed)
Patient was given a prescription 04/15/15 and would like to change to a generic due to cost. Confirmed pharmacy with patient. Patient says no need to return her call her unless you have questions.

## 2015-04-18 MED ORDER — NORETHINDRONE ACET-ETHINYL EST 1-20 MG-MCG PO TABS: 1.0000 | ORAL_TABLET | Freq: Every day | ORAL | Status: DC

## 2015-04-18 NOTE — Telephone Encounter (Signed)
Patient needs lower dose due to age. Can do one refill of Loestrin 1/20, that will give her one month only to decide how to manage.

## 2015-04-18 NOTE — Telephone Encounter (Signed)
Spoke with patient. Advised patient there is no generic for Lo Loestrin Fe. Advised there is a savings card that she can use which will make the rx no more than $25 a month. Patient states that she is unable to pay $25 a month at this time. "I really need to stay around the 10 dollar range like I was at before. I just can not afford it." Advised I will speak with [name] CNM and return call with further recommendations. Patient is agreeable.

## 2015-04-18 NOTE — Telephone Encounter (Signed)
Patient returned call. Patient states "Did you make sure to let her know what I can not afford the 25 dollars a month?" Advised I did speak with [name] CNM regarding this and due to age she needs to be on a lower dose of estrogen to reduce risks of complications. "It is hard to believe that in the entire world of medicine this is the only pill that has 10 of estrogen." Advised this is the only OCP with 10 mcg of estrogen at this time and there is no generic currently. Patient would like to be notified if and when a generic is released. Advised I can place a note in her chart but can not guarantee when this will be. Patient is agreeable and will continue with Junel 1/20 for one month and will notify the office if she would like to be on Lo Loestrin Fe.  Encounter previously closed.

## 2015-04-18 NOTE — Telephone Encounter (Signed)
Spoke with patient. Advised of message as seen below from PepsiCoDeborah Leonard CNM. Patient is agreeable. Rx for Junel 1/20 #1 0RF sent to pharmacy on file. Patient will return call if she would like to continue with Lo Loestrin Fe after this month.  Routing to provider for final review. Patient agreeable to disposition. Will close encounter.

## 2015-09-17 ENCOUNTER — Encounter: Payer: Self-pay | Admitting: *Deleted

## 2015-09-17 ENCOUNTER — Emergency Department (INDEPENDENT_AMBULATORY_CARE_PROVIDER_SITE_OTHER)
Admission: EM | Admit: 2015-09-17 | Discharge: 2015-09-17 | Disposition: A | Discharge status: Home / Self Care | Payer: Managed Care, Other (non HMO) | Source: Home / Self Care | Attending: Family Medicine | Admitting: Family Medicine

## 2015-09-17 DIAGNOSIS — R0981 Nasal congestion: Secondary | ICD-10-CM | POA: Diagnosis not present

## 2015-09-17 DIAGNOSIS — J3489 Other specified disorders of nose and nasal sinuses: Secondary | ICD-10-CM

## 2015-09-17 DIAGNOSIS — M62830 Muscle spasm of back: Secondary | ICD-10-CM | POA: Diagnosis not present

## 2015-09-17 DIAGNOSIS — R059 Cough, unspecified: Secondary | ICD-10-CM

## 2015-09-17 DIAGNOSIS — R05 Cough: Secondary | ICD-10-CM

## 2015-09-17 MED ORDER — DEXAMETHASONE SODIUM PHOSPHATE 10 MG/ML IJ SOLN
10.0000 mg | Freq: Once | INTRAMUSCULAR | Status: AC
  Administered 2015-09-17: 10 mg via INTRAMUSCULAR

## 2015-09-17 MED ORDER — KETOROLAC TROMETHAMINE 60 MG/2ML IM SOLN
60.0000 mg | Freq: Once | INTRAMUSCULAR | Status: AC
  Administered 2015-09-17: 60 mg via INTRAMUSCULAR

## 2015-09-17 MED ORDER — AZITHROMYCIN 250 MG PO TABS: 250.0000 mg | ORAL_TABLET | Freq: Every day | ORAL | Status: DC

## 2015-09-17 MED ORDER — OXYMETAZOLINE HCL 0.05 % NA SOLN: 1.0000 | Freq: Two times a day (BID) | NASAL | Status: DC

## 2015-09-17 MED ORDER — FLUTICASONE PROPIONATE 50 MCG/ACT NA SUSP: 2.0000 | Freq: Every day | NASAL | Status: DC

## 2015-09-17 NOTE — Discharge Instructions (Signed)
You may take 400-600mg  Ibuprofen (Motrin) every 6-8 hours for fever and pain  Alternate with Tylenol  You may take 500mg  Tylenol every 4-6 hours as needed for fever and pain  Follow-up with your primary care provider next week for recheck of symptoms if not improving.  Be sure to drink plenty of fluids and rest, at least 8hrs of sleep a night, preferably more while you are sick. Return urgent care or go to closest ER if you cannot keep down fluids/signs of dehydration, fever not reducing with Tylenol, difficulty breathing/wheezing, stiff neck, worsening condition, or other concerns (see below)   Your symptoms are likely due to a virus such as the common cold, however, if you developing worsening chest congestion with shortness of breath, persistent fever for 3 days, or symptoms not improving in 4-5 days, you may fill the antibiotic (azithromycin).  If you do fill the antibiotic,  please take antibiotics as prescribed and be sure to complete entire course even if you start to feel better to ensure infection does not come back.  You should use Afrin twice daily as needed for nasal congestion, no longer than 4 consecutive days as it may make symptoms worse.  Then use Flonase after 5-10 minutes of using Afrin.  You may use Flonase once or twice daily for at least 2 weeks, or seasonally as needed.

## 2015-09-17 NOTE — ED Provider Notes (Signed)
CSN: [id number]     Arrival date & time 09/17/15  0806 History   First MD Initiated Contact with Patient 09/17/15 [phone]     Chief Complaint  Patient presents with  . Nasal Congestion   (Consider location/radiation/quality/duration/timing/severity/associated sxs/prior Treatment) HPI The pt is a 50[facility] c/o 2-3 days of moderately severe, worsening nasal congestion, sinus pressure, nonproductive cough and Right posterior shoulder pain that is worse with certain movements and positions.  She has been taking an allergy pill and Mucinex but no relief. She states it is difficult to sleep well at night due to the congestion.  Her daughter is graduating tomorrow so she is hoping to feel better by then. Denies fever, n/v/d.Denies recent travel. Denies chest pain or SOB.   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    Past Medical History  Diagnosis Date  . Lichen simplex chronicus   . Gestational diabetes    History reviewed. No pertinent past surgical history. Family History  Problem Relation Age of Onset  . Deep vein thrombosis Mother     on HRT  . Breast cancer Maternal Grandmother     post menopausal  . Diabetes Father   . Parkinson's disease Father    Social History  Substance Use Topics  . Smoking status: Never Smoker   . Smokeless tobacco: Never Used  . Alcohol Use: No   OB History    Gravida Para Term Preterm AB TAB SAB Ectopic Multiple Living   4 2   2  2   2      Review of Systems  Constitutional: Negative for fever and chills.  HENT: Positive for congestion, ear pain ( fullness), rhinorrhea, sinus pressure and sneezing. Negative for sore throat, trouble swallowing and voice change.   Respiratory: Positive for cough. Negative for shortness of breath.   Cardiovascular: Negative for chest pain and palpitations.  Gastrointestinal: Negative for nausea, vomiting, abdominal pain and diarrhea.  Musculoskeletal: Positive for myalgias and back  pain. Negative for arthralgias.       Right posterior shoulder  Skin: Negative for rash.  Neurological: Positive for headaches. Negative for dizziness and light-headedness.    Allergies  Betadine; Nitrofurantoin; and Povidone-iodine  Home Medications   Prior to Admission medications   Medication Sig Start Date End Date Taking? Authorizing Provider  Ascorbic Acid (VITAMIN C) 1000 MG tablet Take 1,000 mg by mouth daily.    Historical Provider, MD  azithromycin (ZITHROMAX) 250 MG tablet Take 1 tablet (250 mg total) by mouth daily. Take first 2 tablets together, then 1 every day until finished. 09/17/15   Junius FinnerErin O'Malley, PA-C  Calcium Carbonate (CALCIUM 600 PO) Take by mouth daily.    Historical Provider, MD  CRANBERRY PO Take 500 mg by mouth daily.    Historical Provider, MD  Cyanocobalamin (VITAMIN B 12 PO) Take 1,000 mg by mouth daily.    Historical Provider, MD  fluticasone (FLONASE) 50 MCG/ACT nasal spray Place 2 sprays into both nostrils daily. 09/17/15   Junius FinnerErin O'Malley, PA-C  Lactobacillus (REPHRESH PRO-B PO) Take by mouth daily.    Historical Provider, MD  loratadine (CLARITIN) 10 MG tablet Take 10 mg by mouth daily as needed for allergies.    Historical Provider, MD  Magnesium 250 MG TABS Take by mouth as needed.     Historical Provider, MD  Multiple Vitamins-Minerals (MULTIVITAMIN PO) Take by mouth daily.    Historical Provider, MD  norethindrone-ethinyl estradiol (JUNEL 1/20)  1-20 MG-MCG tablet Take 1 tablet by mouth daily. 04/18/15   Verner Chol, CNM  Omega-3 Fatty Acids (FISH OIL PO) Take by mouth daily.    Historical Provider, MD  oxymetazoline (AFRIN NASAL SPRAY) 0.05 % nasal spray Place 1 spray into both nostrils 2 (two) times daily. 09/17/15   Junius Finner, PA-C  VITAMIN D, CHOLECALCIFEROL, PO Take 5,000 Int'l Units by mouth daily.    Historical Provider, MD   Meds Ordered and Administered this Visit   Medications  dexamethasone (DECADRON) injection 10 mg (not  administered)  ketorolac (TORADOL) injection 60 mg (not administered)    BP 121/83 mmHg  Pulse 92  Temp(Src) 98.3 F (36.8 C) (Oral)  Resp 16  Ht 5' 7.5" (1.715 m)  Wt 149 lb (67.586 kg)  BMI 22.98 kg/m2  SpO2 98%  LMP 09/12/2015 No data found.   Physical Exam  Constitutional: She appears well-developed and well-nourished. No distress.  HENT:  Head: Normocephalic and atraumatic.  Right Ear: Tympanic membrane normal.  Left Ear: Tympanic membrane normal.  Nose: Mucosal edema present. Right sinus exhibits maxillary sinus tenderness. Right sinus exhibits no frontal sinus tenderness. Left sinus exhibits maxillary sinus tenderness. Left sinus exhibits no frontal sinus tenderness.  Mouth/Throat: Uvula is midline, oropharynx is clear and moist and mucous membranes are normal.  Eyes: Conjunctivae are normal. No scleral icterus.  Neck: Normal range of motion. Neck supple.  Cardiovascular: Normal rate, regular rhythm and normal heart sounds.   Pulmonary/Chest: Effort normal and breath sounds normal. No respiratory distress. She has no wheezes. She has no rales.  Musculoskeletal: Normal range of motion. She exhibits tenderness. She exhibits no edema.  No midline spinal tenderness. Tenderness to Right upper trapezius and rhomboid muscles.   Neurological: She is alert.  Skin: Skin is warm and dry. She is not diaphoretic.  Nursing note and vitals reviewed.   ED Course  Procedures (including critical care time)  Labs Review Labs Reviewed - No data to display  Imaging Review No results found.   MDM   1. Sinus congestion   2. Sinus pressure   3. Cough   4. Back muscle spasm    Pt c/o 2-3 days of worsening sinus symptoms. No relief with OTC medication.  Low concern for bacterial infection at this time given short duration of symptoms, pt is afebrile.  Will treat symptomatically for likely viral infection.  Tx in UC: Decadron  IM and Toradol   Rx: Flonase and Afrin.  Prescription to hold for Azithromycin if symptoms not improving in 4-5 days or fever develops.  May use OTC Mucinex, sudafed, acetaminophen, ibuprofen, and sinus rinses.   F/u with PCP in 7-10 days if not improving. Patient verbalized understanding and agreement with treatment plan.   Junius Finner, PA-C 09/17/15 0900

## 2015-09-17 NOTE — ED Notes (Signed)
Pt c/o nasal congestion, sinus pressure, nonproductive cough, and RT shoulder blade pain x 2 days. Denies fever. She has taken Mucinex and OTC allergy pill without relief.

## 2015-10-16 ENCOUNTER — Telehealth: Payer: Self-pay | Admitting: Certified Nurse Midwife

## 2015-10-16 NOTE — Telephone Encounter (Signed)
Patient states that at her last visit 03/2015 with DL she discussed going on a lower dose OCP. However due to her insurance at the time it was not an option.  She now has new insurance and would like to try the lower dose OCP now. She is currently taking Junel.  Please advise

## 2015-10-16 NOTE — Telephone Encounter (Signed)
Patient said she discussed starting a new medication at her last visit. Patient would like to talk with Verner Choleborah Leonard's nurse about possibly staring this new medication.

## 2015-10-16 NOTE — Telephone Encounter (Signed)
Message left to return call to Gwin Eagon at [phone].    

## 2015-10-17 ENCOUNTER — Other Ambulatory Visit: Payer: Self-pay | Admitting: Certified Nurse Midwife

## 2015-10-17 MED ORDER — NORETHIN-ETH ESTRAD-FE BIPHAS 1 MG-10 MCG / 10 MCG PO TABS: 1.0000 | ORAL_TABLET | Freq: Every day | ORAL | Status: DC

## 2015-10-17 NOTE — Telephone Encounter (Signed)
Return call to patient. Advised of instructions from Debbi to change to Lo Loestrin after completion of this pack of pills.  Per Debbi, instructed to begin Lo Loestin four days after last Junel 1/20 and to use a BUM for first month. Discussed difference in Lo Loestrin and to take all pills in the pack and go from one pack to the next. Savings card information given,  Routing to provider for final review. Patient agreeable to disposition. Will close encounter.

## 2015-10-17 NOTE — Telephone Encounter (Signed)
patient wants to speak with the nurse concerning her birth control. She states she spoke with Consuella LoseElaine yesterday and would like to talk with someone regarding this prescription.

## 2015-10-17 NOTE — Telephone Encounter (Signed)
Please let her know that I placed order for LoLoestrin and start when she completes her current pack

## 2015-10-21 ENCOUNTER — Telehealth: Payer: Self-pay | Admitting: Certified Nurse Midwife

## 2015-10-21 MED ORDER — NORETHIN-ETH ESTRAD-FE BIPHAS 1 MG-10 MCG / 10 MCG PO TABS: 1.0000 | ORAL_TABLET | Freq: Every day | ORAL | Status: DC

## 2015-10-21 NOTE — Telephone Encounter (Signed)
Spoke with patient. Patient states she was advised by the pharmacy that with her insurance plan it will be cheaper to fill her Lo Loestrin Fe 3 months at a time. Asking if this can be sent to the pharmacy with this change. Rx for Lo Loestrin Fe #3 1RF until patient's aex in January 2018 has been sent to pharmacy on file. Patient is agreeable and verbalizes understanding.  Routing to provider for final review. Patient agreeable to disposition. Will close encounter.

## 2015-10-21 NOTE — Telephone Encounter (Signed)
Patient is asking to change her Norethindrone-Ethinyl Estradiol-Fe Biphas (LO LOESTRIN FE) 1 MG-10 MCG / 10 MCG tablet to a 3 month with 3 refills. Confirmed pharmacy with patient.

## 2015-11-04 ENCOUNTER — Other Ambulatory Visit: Payer: Self-pay | Admitting: Obstetrics & Gynecology

## 2015-11-04 DIAGNOSIS — Z139 Encounter for screening, unspecified: Secondary | ICD-10-CM

## 2015-12-03 ENCOUNTER — Ambulatory Visit: Payer: Managed Care, Other (non HMO)

## 2015-12-17 ENCOUNTER — Ambulatory Visit (INDEPENDENT_AMBULATORY_CARE_PROVIDER_SITE_OTHER): Payer: Managed Care, Other (non HMO)

## 2015-12-17 DIAGNOSIS — Z1231 Encounter for screening mammogram for malignant neoplasm of breast: Secondary | ICD-10-CM

## 2015-12-17 DIAGNOSIS — Z139 Encounter for screening, unspecified: Secondary | ICD-10-CM

## 2016-02-11 ENCOUNTER — Telehealth: Payer: Self-pay | Admitting: Certified Nurse Midwife

## 2016-02-11 NOTE — Telephone Encounter (Signed)
Left voicemail to call and schedule aex with Debbie in December if she would like.

## 2016-04-01 ENCOUNTER — Ambulatory Visit: Payer: 59 | Admitting: Certified Nurse Midwife

## 2016-04-07 ENCOUNTER — Other Ambulatory Visit: Payer: Self-pay | Admitting: Certified Nurse Midwife

## 2016-04-07 NOTE — Telephone Encounter (Signed)
Medication refill request: Norethindrone-Ethinyl Estradiol-Fe Last AEX:  04/15/15 DL Next AEX: [id number]/9512/21/17 DL Last MMG (if hormonal medication request): 12/18/15 Jed [id number] Density C, [facility] Refill authorized: 10/21/15 #3 Packages 1R. Please advise. Thank you.   Routing to PG since DL is out of the office.

## 2016-04-15 ENCOUNTER — Ambulatory Visit (INDEPENDENT_AMBULATORY_CARE_PROVIDER_SITE_OTHER): Payer: Managed Care, Other (non HMO) | Admitting: Certified Nurse Midwife

## 2016-04-15 ENCOUNTER — Encounter: Payer: Self-pay | Admitting: Certified Nurse Midwife

## 2016-04-15 VITALS — BP 106/60 | HR 64 | Resp 16 | Ht 65.5 in | Wt 144.0 lb

## 2016-04-15 DIAGNOSIS — Z01419 Encounter for gynecological examination (general) (routine) without abnormal findings: Secondary | ICD-10-CM

## 2016-04-15 DIAGNOSIS — Z0000 Encounter for general adult medical examination without abnormal findings: Secondary | ICD-10-CM | POA: Diagnosis not present

## 2016-04-15 DIAGNOSIS — Z3041 Encounter for surveillance of contraceptive pills: Secondary | ICD-10-CM | POA: Diagnosis not present

## 2016-04-15 DIAGNOSIS — N951 Menopausal and female climacteric states: Secondary | ICD-10-CM | POA: Diagnosis not present

## 2016-04-15 NOTE — Progress Notes (Signed)
50 y.o. [id number] Married  Caucasian Fe here for annual exam. Contraception OCP with amenorrhea for past 6 months. No hot flashes or night sweats or vaginal dryness issues. Does not want late age pregnancy! Had labs done for life insurance policy, brought with her with elevated bilirubin noted. Needs follow recheck. Sees PCP only prn. No other health issues today.    Patient's last menstrual period was 10/10/2015 (exact date).          Sexually active: Yes.    The current method of family planning is OCP (estrogen/progesterone).    Exercising: No.  exercise Smoker:  no  Health Maintenance: Pap:  04-15-15 neg MMG:  12-17-15 category c density birads 1:neg Colonoscopy: none BMD:   none TDaP:  2011 Shingles: no Pneumonia: no Hep C and HIV: not done Labs: none Self breast exam: done occ   reports that she has never smoked. She has never used smokeless tobacco. She reports that she does not drink alcohol or use drugs.  Past Medical History:  Diagnosis Date  . Gestational diabetes   . Lichen simplex chronicus     History reviewed. No pertinent surgical history.  Current Outpatient Prescriptions  Medication Sig Dispense Refill  . Ascorbic Acid (VITAMIN C) 1000 MG tablet Take 1,000 mg by mouth daily.    . Calcium Carbonate (CALCIUM 600 PO) Take by mouth daily.    Marland Kitchen. CRANBERRY PO Take 500 mg by mouth daily.    . fluticasone (FLONASE) 50 MCG/ACT nasal spray Place 2 sprays into both nostrils daily. 9.9 g 2  . LO LOESTRIN FE 1 MG-10 MCG / 10 MCG tablet TAKE 1 TABLET BY MOUTH DAILY. 28 tablet 0  . loratadine (CLARITIN) 10 MG tablet Take 10 mg by mouth daily as needed for allergies.    . Multiple Vitamins-Minerals (MULTIVITAMIN PO) Take by mouth daily.    . Omega-3 Fatty Acids (FISH OIL PO) Take by mouth daily.     No current facility-administered medications for this visit.     Family History  Problem Relation Age of Onset  . Deep vein thrombosis Mother     on HRT  . Breast cancer  Maternal Grandmother     post menopausal  . Diabetes Father   . Parkinson's disease Father     ROS:  Pertinent items are noted in HPI.  Otherwise, a comprehensive ROS was negative.  Exam:   BP 106/60   Pulse 64   Resp 16   Ht 5' 5.5" (1.664 m)   Wt 144 lb (65.3 kg)   LMP 10/10/2015 (Exact Date)   BMI 23.60 kg/m  Height: 5' 5.5" (166.4 cm) Ht Readings from Last 3 Encounters:  04/15/16 5' 5.5" (1.664 m)  09/17/15 5' 7.5" (1.715 m)  04/15/15 5' 5.5" (1.664 m)    General appearance: alert, cooperative and appears stated age Head: Normocephalic, without obvious abnormality, atraumatic Neck: no adenopathy, supple, symmetrical, trachea midline and thyroid normal to inspection and palpation Lungs: clear to auscultation bilaterally Breasts: normal appearance, no masses or tenderness, No nipple retraction or dimpling, No nipple discharge or bleeding, No axillary or supraclavicular adenopathy Heart: regular rate and rhythm Abdomen: soft, non-tender; no masses,  no organomegaly Extremities: extremities normal, atraumatic, no cyanosis or edema Skin: Skin color, texture, turgor normal. No rashes or lesions Lymph nodes: Cervical, supraclavicular, and axillary nodes normal. No abnormal inguinal nodes palpated Neurologic: Grossly normal   Pelvic: External genitalia:  no lesions,normal female  Urethra:  normal appearing urethra with no masses, tenderness or lesions              Bartholin's and Skene's: normal                 Vagina: normal appearing vagina with normal color and discharge, no lesions              Cervix: multiparous appearance, no cervical motion tenderness and no lesions              Pap taken: No. Bimanual Exam:  Uterus:  normal size, contour, position, consistency, mobility, non-tender, tilts to left              Adnexa: normal adnexa and no mass, fullness, tenderness               Rectovaginal: Confirms               Anus:  normal sphincter tone, no  lesions  Chaperone present: yes  A:  Well Woman with normal exam  Perimenopausal  Contraception OCP desired, but amenorrhea last 6 months with OCP  Follow elevated bilirubin with insurance lab in 6/17.  P:   Reviewed health and wellness pertinent to exam  Discussed perimenopausal and etiology. Also discussed amenorrhea which is not usual for her on OCP, may indicate menopausal change. [facility], patient agreeable. Patient will complete current pack of OCP and come in two weeks after being of OCP. Will abstain or use condoms. Desires other lab follow up at same time. Patient will call to schedule once she looks at pill pack.  Discussed stopping OCP due to age and will discuss once labs are in. OCP not refilled  Labs:FSH,AMH, Bilirubin, Hep C future labs  Pap smear as above not taken   counseled on breast self exam, mammography screening, use and side effects of OCP's, adequate intake of calcium and vitamin D, diet and exercise  return annually or prn  An After Visit Summary was printed and given to the patient.

## 2016-04-15 NOTE — Progress Notes (Signed)
Encounter reviewed Mataio Mele, MD   

## 2016-04-15 NOTE — Patient Instructions (Signed)

## 2016-04-27 ENCOUNTER — Other Ambulatory Visit (INDEPENDENT_AMBULATORY_CARE_PROVIDER_SITE_OTHER): Payer: Managed Care, Other (non HMO)

## 2016-04-27 DIAGNOSIS — Z0000 Encounter for general adult medical examination without abnormal findings: Secondary | ICD-10-CM

## 2016-04-27 DIAGNOSIS — N951 Menopausal and female climacteric states: Secondary | ICD-10-CM

## 2016-04-27 LAB — HEPATITIS C ANTIBODY: HCV Ab: NEGATIVE

## 2016-04-28 LAB — BILIRUBIN, TOTAL: Total Bilirubin: 1.6 mg/dL — ABNORMAL HIGH (ref 0.2–1.2)

## 2016-04-28 LAB — FOLLICLE STIMULATING HORMONE: FSH: 86.4 m[IU]/mL

## 2016-04-29 ENCOUNTER — Ambulatory Visit: Payer: 59 | Admitting: Certified Nurse Midwife

## 2016-04-30 LAB — ANTI MULLERIAN HORMONE: AMH AssessR: <0.03 ng/mL

## 2016-05-04 ENCOUNTER — Other Ambulatory Visit: Payer: Self-pay | Admitting: Certified Nurse Midwife

## 2016-05-04 ENCOUNTER — Telehealth: Payer: Self-pay | Admitting: *Deleted

## 2016-05-04 DIAGNOSIS — Z1211 Encounter for screening for malignant neoplasm of colon: Secondary | ICD-10-CM

## 2016-05-04 NOTE — Telephone Encounter (Signed)
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 Will refer to Dr. Loreta AveMann and she will be called with appointment . Order in for referral

## 2016-05-04 NOTE — Telephone Encounter (Signed)
Spoke with patient, advised of results and recommendations as seen below. Patient asking if she no longer needs to use protection or need to be concerned about pregnancy according to the results? Patient also asking if [name], CNM thinks that long term motrin/ibuprofen use can be cause of elevated bilirubin? Patient states she does not use daily, but this is her "go to" medication for everything and she takes 3 at a time. Patient also asking if she can wait until summer for f/u with GI for bilirubin? RN advised patient not to wait until summer for f/u. 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 Patient states she would also like scheduler for referral to know she needs a late afternoon appt time. Advised patient would review concerns with [name], CNM and return call with recommendations, patient is agreeable.  [name], CNM  Cc: Braxton Feathersebecca Frahm

## 2016-05-04 NOTE — Telephone Encounter (Signed)
Spoke with patient. Patient at dentist, will return call.

## 2016-05-05 NOTE — Telephone Encounter (Signed)
Long term NSAID use can contribute to changes. We could recheck with liver profile if she will omit for 2 weeks any use of any OTC . If remains elevated will need to see GI

## 2016-05-05 NOTE — Telephone Encounter (Signed)
Spoke with patient, advised as seen below per [name], CNM. Patient states she will move forward with referral to Dr. Loreta AveMann. Patient states she is taking care of 2 sick family members in the home and can not be sure that she won't need medication herself and another lab appt  Will take away from work. Patient verbalizes understanding and is agreeable.  Routing to provider for final review. Patient is agreeable to disposition. Will close encounter.

## 2016-05-19 ENCOUNTER — Telehealth: Payer: Self-pay | Admitting: Certified Nurse Midwife

## 2016-05-19 NOTE — Telephone Encounter (Signed)
Spoke with patient. Patient would like copy of allergies and reactions sent to Dr. Loreta AveMann. Reviewed current allergies and reactions with patient, advised will process request for release of medical record. Patient verbalizes understanding and is agreeable.  Copy of allergies and reactions faxed to Dr. Loreta AveMann [phone].  Routing to provider for final review. Patient is agreeable to disposition. Will close encounter.

## 2016-05-19 NOTE — Telephone Encounter (Signed)
Patient was referred to Dr Loreta AveMann and her office is requesting a copy of her allergic reaction information.

## 2016-07-02 LAB — HM COLONOSCOPY

## 2016-11-03 ENCOUNTER — Other Ambulatory Visit: Payer: Self-pay | Admitting: Certified Nurse Midwife

## 2016-11-03 DIAGNOSIS — Z1231 Encounter for screening mammogram for malignant neoplasm of breast: Secondary | ICD-10-CM

## 2016-12-17 ENCOUNTER — Ambulatory Visit (INDEPENDENT_AMBULATORY_CARE_PROVIDER_SITE_OTHER): Payer: Managed Care, Other (non HMO)

## 2016-12-17 DIAGNOSIS — Z1231 Encounter for screening mammogram for malignant neoplasm of breast: Secondary | ICD-10-CM | POA: Diagnosis not present

## 2017-04-27 ENCOUNTER — Other Ambulatory Visit (HOSPITAL_COMMUNITY)
Admission: RE | Admit: 2017-04-27 | Discharge: 2017-04-27 | Disposition: A | Discharge status: Home / Self Care | Payer: Managed Care, Other (non HMO) | Source: Ambulatory Visit | Attending: Obstetrics & Gynecology | Admitting: Obstetrics & Gynecology

## 2017-04-27 ENCOUNTER — Encounter: Payer: Self-pay | Admitting: Certified Nurse Midwife

## 2017-04-27 ENCOUNTER — Other Ambulatory Visit: Payer: Self-pay

## 2017-04-27 ENCOUNTER — Ambulatory Visit (INDEPENDENT_AMBULATORY_CARE_PROVIDER_SITE_OTHER): Payer: Managed Care, Other (non HMO) | Admitting: Certified Nurse Midwife

## 2017-04-27 VITALS — BP 110/70 | HR 72 | Resp 16 | Wt 137.0 lb

## 2017-04-27 DIAGNOSIS — Z124 Encounter for screening for malignant neoplasm of cervix: Secondary | ICD-10-CM

## 2017-04-27 DIAGNOSIS — R6889 Other general symptoms and signs: Secondary | ICD-10-CM

## 2017-04-27 DIAGNOSIS — Z0000 Encounter for general adult medical examination without abnormal findings: Secondary | ICD-10-CM

## 2017-04-27 DIAGNOSIS — Z780 Asymptomatic menopausal state: Secondary | ICD-10-CM | POA: Diagnosis not present

## 2017-04-27 DIAGNOSIS — Z01419 Encounter for gynecological examination (general) (routine) without abnormal findings: Secondary | ICD-10-CM | POA: Diagnosis not present

## 2017-04-27 NOTE — Progress Notes (Signed)
52 y.o. [id number] Married  Caucasian Fe here for annual exam. Menopausal no HRT. Denies vaginal bleeding or vaginal dryness. Occasional hot flash, no insomnia. Had colonoscopy with Dr. Loreta Ave and negative. Evaluated for elevated bilirubin and felt no issues. Would like screening labs today. Last ones done in 2017 with work. Patient sees PCP near her home prn, not for aex. No other health issues today. 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  No LMP recorded. Patient is not currently having periods (Reason: Other).          Sexually active: Yes.    The current method of family planning is condoms most of the time.    Exercising: No.  The patient does not participate in regular exercise at present. Smoker:  no  Health Maintenance: Pap:  04-15-15 neg History of Abnormal Pap: no MMG:  12-17-16 category c density birads 1:neg Self Breast exams: occasional Colonoscopy:  2018 with Dr. Loreta Ave - normal per patient - f/u 10 years BMD:   none TDaP:  2011 Shingles: none Pneumonia: none Hep C and HIV: Hep c neg 2018 Labs: discuss today   reports that  has never smoked. she has never used smokeless tobacco. She reports that she does not drink alcohol or use drugs.  Past Medical History:  Diagnosis Date  . Gestational diabetes   . Lichen simplex chronicus     No past surgical history on file.  Current Outpatient Medications  Medication Sig Dispense Refill  . Ascorbic Acid (VITAMIN C) 1000 MG tablet Take 1,000 mg by mouth daily.    . Calcium Carbonate (CALCIUM 600 PO) Take by mouth daily.    Marland Kitchen CRANBERRY PO Take 500 mg by mouth daily.    . fluticasone (FLONASE) 50 MCG/ACT nasal spray Place 2 sprays into both nostrils daily. 9.9 g 2  . loratadine (CLARITIN) 10 MG tablet Take 10 mg by mouth daily as needed for allergies.    . Multiple Vitamins-Minerals (MULTIVITAMIN PO) Take by mouth daily.    . Omega-3 Fatty Acids (FISH OIL PO) Take by mouth daily.     No current  facility-administered medications for this visit.     Family History  Problem Relation Age of Onset  . Deep vein thrombosis Mother        on HRT  . Breast cancer Maternal Grandmother        post menopausal  . Diabetes Father   . Parkinson's disease Father     ROS:  Pertinent items are noted in HPI.  Otherwise, a comprehensive ROS was negative.  Exam:   BP 110/70 (BP Location: Right Arm, Patient Position: Sitting, Cuff Size: Normal)   Pulse 72   Resp 16   Wt 137 lb (62.1 kg)   BMI 22.45 kg/m    Ht Readings from Last 3 Encounters:  04/15/16 5' 5.5" (1.664 m)  09/17/15 5' 7.5" (1.715 m)  04/15/15 5' 5.5" (1.664 m)    General appearance: alert, cooperative and appears stated age Head: Normocephalic, without obvious abnormality, atraumatic Neck: no adenopathy, supple, symmetrical, trachea midline and thyroid normal to inspection and palpation Lungs: clear to auscultation bilaterally Breasts: normal appearance, no masses or tenderness, No nipple retraction or dimpling, No nipple discharge or bleeding, No axillary or supraclavicular adenopathy Heart: regular rate and rhythm Abdomen: soft, non-tender; no masses,  no organomegaly Extremities: extremities normal, atraumatic, no cyanosis or edema Skin: Skin color, texture, turgor normal. No rashes or lesions Lymph nodes: Cervical, supraclavicular,  and axillary nodes normal. No abnormal inguinal nodes palpated Neurologic: Grossly normal   Pelvic: External genitalia:  no lesions              Urethra:  normal appearing urethra with no masses, tenderness or lesions              Bartholin's and Skene's: normal                 Vagina: normal appearing vagina with normal color and discharge, no lesions              Cervix: no lesions and normal appearance              Pap taken: Yes.   Bimanual Exam:  Uterus:  normal size, contour, position, consistency, mobility, non-tender              Adnexa: normal adnexa and no mass, fullness,  tenderness               Rectovaginal: Confirms               Anus:  normal sphincter tone, no lesions  Chaperone present: yes  A:  Well Woman with normal exam  Menopausal no HRT  Recent colonoscopy negative repeat 10 years  Screening labs  P:   Reviewed health and wellness pertinent to exam  Discussed if vaginal bleeding she should notify, could be a concern with post menopausal bleeding. Also answered questions regarding expectations with menopause. Has printed material given at last visit.  Labs: CMP,Lipid panel, Vitamin D, TSH  Pap smear: yes   counseled on breast self exam, mammography screening, feminine hygiene, menopause, adequate intake of calcium and vitamin D, diet and exercise  return annually or prn  An After Visit Summary was printed and given to the patient.

## 2017-04-27 NOTE — Patient Instructions (Signed)

## 2017-04-28 LAB — COMPREHENSIVE METABOLIC PANEL
ALT: 28 IU/L (ref 0–32)
AST: 27 IU/L (ref 0–40)
Albumin/Globulin Ratio: 2.3 — ABNORMAL HIGH (ref 1.2–2.2)
Albumin: 4.9 g/dL (ref 3.5–5.5)
Alkaline Phosphatase: 97 IU/L (ref 39–117)
BUN/Creatinine Ratio: 21 (ref 9–23)
BUN: 14 mg/dL (ref 6–24)
Bilirubin Total: 1.6 mg/dL — ABNORMAL HIGH (ref 0.0–1.2)
CALCIUM: 9.8 mg/dL (ref 8.7–10.2)
CO2: 24 mmol/L (ref 20–29)
Chloride: 99 mmol/L (ref 96–106)
Creatinine, Ser: 0.67 mg/dL (ref 0.57–1.00)
GFR calc Af Amer: 116 mL/min/{1.73_m2} (ref >59)
GFR, EST NON AFRICAN AMERICAN: 101 mL/min/{1.73_m2} (ref >59)
GLUCOSE: 84 mg/dL (ref 65–99)
Globulin, Total: 2.1 g/dL (ref 1.5–4.5)
Potassium: 4.4 mmol/L (ref 3.5–5.2)
Sodium: 141 mmol/L (ref 134–144)
Total Protein: 7 g/dL (ref 6.0–8.5)

## 2017-04-28 LAB — TSH: TSH: 1.86 u[IU]/mL (ref 0.450–4.500)

## 2017-04-28 LAB — LIPID PANEL
CHOL/HDL RATIO: 2.3 ratio (ref 0.0–4.4)
Cholesterol, Total: 239 mg/dL — ABNORMAL HIGH (ref 100–199)
HDL: 102 mg/dL (ref >39)
LDL CALC: 130 mg/dL — AB (ref 0–99)
Triglycerides: 36 mg/dL (ref 0–149)
VLDL Cholesterol Cal: 7 mg/dL (ref 5–40)

## 2017-04-28 LAB — VITAMIN D 25 HYDROXY (VIT D DEFICIENCY, FRACTURES): Vit D, 25-Hydroxy: 31.9 ng/mL (ref 30.0–100.0)

## 2017-04-29 LAB — CYTOLOGY - PAP: DIAGNOSIS: NEGATIVE

## 2017-10-03 ENCOUNTER — Encounter: Payer: Self-pay | Admitting: Family Medicine

## 2017-10-03 ENCOUNTER — Ambulatory Visit (INDEPENDENT_AMBULATORY_CARE_PROVIDER_SITE_OTHER): Payer: Managed Care, Other (non HMO) | Admitting: Family Medicine

## 2017-10-03 VITALS — BP 111/59 | HR 77 | Ht 66.0 in | Wt 143.0 lb

## 2017-10-03 DIAGNOSIS — E559 Vitamin D deficiency, unspecified: Secondary | ICD-10-CM

## 2017-10-03 DIAGNOSIS — D649 Anemia, unspecified: Secondary | ICD-10-CM | POA: Diagnosis not present

## 2017-10-03 DIAGNOSIS — Z0184 Encounter for antibody response examination: Secondary | ICD-10-CM

## 2017-10-03 NOTE — Progress Notes (Signed)
Subjective:    Patient ID: Vanessa Chase, female    DOB: 10/13/1965, 52 y.o.   MRN: [id number]  HPI   52 year old female comes here to reestablish care today.  Last seen in 2010.  She follows with midwife [name] and goes yearly for regular exam.  She actually is up-to-date on her colorectal screening and brought a copy of that for up to update her chart today.  She would like to be screened for iron deficiency.  She no longer has a.  And is postmenopausal.  She does get some occasional mild hot flashes.  She would also like to have immunity status testing for measles.  She works at [employer], [employer] and so is around people who travel from all over the world.  She is also a Location managerpre-kindergarten school worker and feels she is exposed to a lot of viruses and bugs.    She also had blood work done in January and had a borderline low vitamin D level.  She has been taking a supplement of 5000 IU daily since then and is due to have that rechecked.   Review of Systems  Constitutional: Negative for diaphoresis, fever and unexpected weight change.  HENT: Negative for hearing loss, postnasal drip, sneezing and tinnitus.   Eyes: Negative for visual disturbance.  Respiratory: Negative for cough and wheezing.   Cardiovascular: Negative for chest pain and palpitations.  Genitourinary: Negative for vaginal bleeding and vaginal discharge.  Musculoskeletal: Negative for arthralgias.  Neurological: Negative for headaches.  Hematological: Negative for adenopathy. Does not bruise/bleed easily.     BP (!) 111/59   Pulse 77   Ht 5\' 6"  (1.676 m)   Wt 143 lb (64.9 kg)   SpO2 100%   BMI 23.08 kg/m     Allergies  Allergen Reactions  . Betadine [Povidone Iodine]     hives  . Nitrofurantoin     REACTION: Rash, SOB  . Povidone-Iodine     REACTION: Allergic reaction    Past Medical History:  Diagnosis Date  . Gestational diabetes   . Lichen simplex chronicus     Past Surgical History:   Procedure Laterality Date  . NO PAST SURGERIES      Social History   Socioeconomic History  . Marital status: Married    Spouse name: Not on file  . Number of children: Not on file  . Years of education: Not on file  . Highest education level: Not on file  Occupational History  . Occupation: pre-K Runner, broadcasting/film/videoteacher   Social Needs  . Financial resource strain: Not on file  . Food insecurity:    Worry: Not on file    Inability: Not on file  . Transportation needs:    Medical: Not on file    Non-medical: Not on file  Tobacco Use  . Smoking status: Never Smoker  . Smokeless tobacco: Never Used  Substance and Sexual Activity  . Alcohol use: Yes    Comment: occ  . Drug use: No  . Sexual activity: Yes    Partners: Male  Lifestyle  . Physical activity:    Days per week: Not on file    Minutes per session: Not on file  . Stress: Not on file  Relationships  . Social connections:    Talks on phone: Not on file    Gets together: Not on file    Attends religious service: Not on file    Active member of club or organization: Not on file  Attends meetings of clubs or organizations: Not on file    Relationship status: Not on file  . Intimate partner violence:    Fear of current or ex partner: Not on file    Emotionally abused: Not on file    Physically abused: Not on file    Forced sexual activity: Not on file  Other Topics Concern  . Not on file  Social History Narrative  . Not on file    Family History  Problem Relation Age of Onset  . Deep vein thrombosis Mother        on HRT  . Breast cancer Maternal Grandmother        post menopausal  . Diabetes Father   . Parkinson's disease Father   . Stomach cancer Paternal Grandmother     Outpatient Encounter Medications as of 10/03/2017  Medication Sig  . Cholecalciferol (VITAMIN D3) 5000 units TABS Take 1 tablet by mouth daily.  . Ginkgo Biloba Extract 60 MG CAPS Take 1 capsule by mouth daily.  Marland Kitchen loratadine (CLARITIN) 10 MG  tablet Take 10 mg by mouth daily as needed for allergies.  . Omega-3 Fatty Acids (FISH OIL PO) Take by mouth daily.  . Turmeric 500 MG CAPS Take 1 capsule by mouth daily.  . [DISCONTINUED] Ascorbic Acid (VITAMIN C) 1000 MG tablet Take 1,000 mg by mouth daily.  . [DISCONTINUED] Calcium Carbonate (CALCIUM 600 PO) Take by mouth daily.  . [DISCONTINUED] CRANBERRY PO Take 500 mg by mouth daily.  . [DISCONTINUED] fluticasone (FLONASE) 50 MCG/ACT nasal spray Place 2 sprays into both nostrils daily.  . [DISCONTINUED] Multiple Vitamins-Minerals (MULTIVITAMIN PO) Take by mouth daily.   No facility-administered encounter medications on file as of 10/03/2017.          Objective:   Physical Exam  Constitutional: She is oriented to person, place, and time. She appears well-developed and well-nourished.  HENT:  Head: Normocephalic and atraumatic.  Cardiovascular: Normal rate, regular rhythm and normal heart sounds.  Pulmonary/Chest: Effort normal and breath sounds normal.  Neurological: She is alert and oriented to person, place, and time.  Skin: Skin is warm and dry.  Psychiatric: She has a normal mood and affect. Her behavior is normal.        Assessment & Plan:   Immunity status testing-we will check for measles mumps and rubella and will vaccinate her if she is not immune.  She does remember having chickenpox as a child.  Low vit D - due to recheck has been on OTC 5000 IU daily.    Will check for iron def anemia-we will check CBC and iron panel.  Plans to schedule a physical next month.  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  Given some and information and handouts provided today.

## 2017-10-04 LAB — CBC
HEMATOCRIT: 38.9 % (ref 35.0–45.0)
HEMOGLOBIN: 13.4 g/dL (ref 11.7–15.5)
MCH: 30.7 pg (ref 27.0–33.0)
MCHC: 34.4 g/dL (ref 32.0–36.0)
MCV: 89.2 fL (ref 80.0–100.0)
MPV: 12.6 fL — ABNORMAL HIGH (ref 7.5–12.5)
Platelets: 171 10*3/uL (ref 140–400)
RBC: 4.36 10*6/uL (ref 3.80–5.10)
RDW: 11.9 % (ref 11.0–15.0)
WBC: 3.4 10*3/uL — ABNORMAL LOW (ref 3.8–10.8)

## 2017-10-04 LAB — IRON,TIBC AND FERRITIN PANEL
%SAT: 28 % (calc) (ref 11–50)
FERRITIN: 63 ng/mL (ref 10–232)
Iron: 102 ug/dL (ref 45–160)
TIBC: 364 mcg/dL (calc) (ref 250–450)

## 2017-10-04 LAB — MEASLES/MUMPS/RUBELLA IMMUNITY
Mumps IgG: 159 AU/mL
Rubella: 3.7 index
Rubeola IgG: >300 AU/mL

## 2017-10-04 LAB — VITAMIN D 25 HYDROXY (VIT D DEFICIENCY, FRACTURES): VIT D 25 HYDROXY: 43 ng/mL (ref 30–100)

## 2017-10-05 ENCOUNTER — Encounter: Payer: Self-pay | Admitting: Family Medicine

## 2017-11-01 ENCOUNTER — Encounter: Payer: Self-pay | Admitting: Family Medicine

## 2017-11-07 ENCOUNTER — Other Ambulatory Visit: Payer: Self-pay | Admitting: Certified Nurse Midwife

## 2017-11-07 ENCOUNTER — Ambulatory Visit (INDEPENDENT_AMBULATORY_CARE_PROVIDER_SITE_OTHER): Payer: Managed Care, Other (non HMO) | Admitting: Family Medicine

## 2017-11-07 ENCOUNTER — Encounter: Payer: Self-pay | Admitting: Family Medicine

## 2017-11-07 VITALS — BP 122/67 | HR 78 | Ht 66.0 in | Wt 141.0 lb

## 2017-11-07 DIAGNOSIS — Z23 Encounter for immunization: Secondary | ICD-10-CM | POA: Diagnosis not present

## 2017-11-07 DIAGNOSIS — Z0000 Encounter for general adult medical examination without abnormal findings: Secondary | ICD-10-CM | POA: Diagnosis not present

## 2017-11-07 DIAGNOSIS — Z1231 Encounter for screening mammogram for malignant neoplasm of breast: Secondary | ICD-10-CM

## 2017-11-07 NOTE — Progress Notes (Signed)
Subjective:     Vanessa Chase is a 52 y.o. female and is here for a comprehensive physical exam. The patient reports no problems. Has been walking for exercise. Hasn't been able to make it to the gym. She has been helping to take care of her aging parents as their health declines. She has a sister in the area that helps.  He does take some supplements.  Social History   Socioeconomic History  . Marital status: Married    Spouse name: Not on file  . Number of children: Not on file  . Years of education: Not on file  . Highest education level: Not on file  Occupational History  . Occupation: pre-K Runner, broadcasting/film/video   Social Needs  . Financial resource strain: Not on file  . Food insecurity:    Worry: Not on file    Inability: Not on file  . Transportation needs:    Medical: Not on file    Non-medical: Not on file  Tobacco Use  . Smoking status: Never Smoker  . Smokeless tobacco: Never Used  Substance and Sexual Activity  . Alcohol use: Yes    Comment: occ  . Drug use: No  . Sexual activity: Yes    Partners: Male  Lifestyle  . Physical activity:    Days per week: Not on file    Minutes per session: Not on file  . Stress: Not on file  Relationships  . Social connections:    Talks on phone: Not on file    Gets together: Not on file    Attends religious service: Not on file    Active member of club or organization: Not on file    Attends meetings of clubs or organizations: Not on file    Relationship status: Not on file  . Intimate partner violence:    Fear of current or ex partner: Not on file    Emotionally abused: Not on file    Physically abused: Not on file    Forced sexual activity: Not on file  Other Topics Concern  . Not on file  Social History Narrative  . Not on file   Health Maintenance  Topic Date Due  . HIV Screening  03/02/1979  . INFLUENZA VACCINE  11/24/2017  . MAMMOGRAM  12/18/2018  . TETANUS/TDAP  02/21/2020  . PAP SMEAR  04/27/2020  . COLONOSCOPY   07/03/2026  . Hepatitis C Screening  Completed    The following portions of the patient's history were reviewed and updated as appropriate: allergies, current medications, past family history, past medical history, past social history, past surgical history and problem list.  Review of Systems A comprehensive review of systems was negative.   Objective:    BP 122/67   Pulse 78   Ht 5\' 6"  (1.676 m)   Wt 141 lb (64 kg)   SpO2 99%   BMI 22.76 kg/m  General appearance: alert ,  Head: Normocephalic, without obvious abnormality, atraumatic Eyes: conj clear, EOMI, PEERLA Ears: normal TM's and external ear canals both ears Nose: Nares normal. Septum midline. Mucosa normal. No drainage or sinus tenderness. Throat: lips, mucosa, and tongue normal; teeth and gums normal Neck: no adenopathy, no carotid bruit, no JVD, supple, symmetrical, trachea midline and thyroid not enlarged, symmetric, no tenderness/mass/nodules Back: symmetric, no curvature. ROM normal. No CVA tenderness. Lungs: clear to auscultation bilaterally Heart: regular rate and rhythm, S1, S2 normal, no murmur, click, rub or gallop Abdomen: soft, non-tender; bowel sounds normal; no  masses,  no organomegaly Extremities: extremities normal, atraumatic, no cyanosis or edema Pulses: 2+ and symmetric Skin: Skin color, texture, turgor normal. No rashes or lesions Neurologic: Alert and oriented X 3, normal strength and tone. Normal symmetric reflexes. Normal coordination and gait    Assessment:    Healthy female exam.      Plan:     See After Visit Summary for Counseling Recommendations    Keep up a regular exercise program and make sure you are eating a healthy diet. Try to eat 4 servings of dairy a day, or if you are lactose intolerant take a calcium with vitamin D daily.  Your vaccines are up to date.   Discussed need for shingles vaccine.

## 2017-11-07 NOTE — Patient Instructions (Addendum)

## 2017-11-08 NOTE — Addendum Note (Signed)
Addended by: Deno EtienneBARKLEY, Maverick Dieudonne L on: 11/08/2017 07:44 AM   Modules accepted: Orders

## 2017-12-21 ENCOUNTER — Ambulatory Visit (INDEPENDENT_AMBULATORY_CARE_PROVIDER_SITE_OTHER): Payer: Managed Care, Other (non HMO)

## 2017-12-21 DIAGNOSIS — Z1231 Encounter for screening mammogram for malignant neoplasm of breast: Secondary | ICD-10-CM | POA: Diagnosis not present

## 2018-03-13 ENCOUNTER — Ambulatory Visit: Payer: Managed Care, Other (non HMO)

## 2018-03-17 ENCOUNTER — Ambulatory Visit (INDEPENDENT_AMBULATORY_CARE_PROVIDER_SITE_OTHER): Payer: Managed Care, Other (non HMO) | Admitting: Physician Assistant

## 2018-03-17 VITALS — Temp 98.1°F

## 2018-03-17 DIAGNOSIS — Z23 Encounter for immunization: Secondary | ICD-10-CM | POA: Diagnosis not present

## 2018-03-17 NOTE — Progress Notes (Signed)
Pt came into clinic today for second Shingrix vaccine. Reports some nausea/fatigue after first injection, lasted only a couple of hours. Pt tolerated injection today in right deltoid well, no immediate complications.

## 2018-05-05 ENCOUNTER — Ambulatory Visit (INDEPENDENT_AMBULATORY_CARE_PROVIDER_SITE_OTHER): Payer: Managed Care, Other (non HMO) | Admitting: Certified Nurse Midwife

## 2018-05-05 ENCOUNTER — Encounter: Payer: Self-pay | Admitting: Certified Nurse Midwife

## 2018-05-05 ENCOUNTER — Other Ambulatory Visit: Payer: Self-pay

## 2018-05-05 ENCOUNTER — Other Ambulatory Visit (HOSPITAL_COMMUNITY)
Admission: RE | Admit: 2018-05-05 | Discharge: 2018-05-05 | Disposition: A | Discharge status: Home / Self Care | Payer: Managed Care, Other (non HMO) | Source: Ambulatory Visit | Attending: Obstetrics & Gynecology | Admitting: Obstetrics & Gynecology

## 2018-05-05 VITALS — BP 110/74 | HR 64 | Resp 16 | Ht 64.75 in | Wt 145.0 lb

## 2018-05-05 DIAGNOSIS — Z124 Encounter for screening for malignant neoplasm of cervix: Secondary | ICD-10-CM

## 2018-05-05 DIAGNOSIS — Z01419 Encounter for gynecological examination (general) (routine) without abnormal findings: Secondary | ICD-10-CM | POA: Diagnosis not present

## 2018-05-05 DIAGNOSIS — Z600 Problems of adjustment to life-cycle transitions: Secondary | ICD-10-CM | POA: Diagnosis not present

## 2018-05-05 NOTE — Patient Instructions (Signed)

## 2018-05-05 NOTE — Progress Notes (Addendum)
53 y.o. L4T6256 Married  Caucasian Fe here for annual exam. Menopausal no HRT. Occasional hot flash, or flush, no night sweats. Occasional vaginal dryness. Sees PCP for labs and aex. . Experiencing empty nest with daughter married and other daughter in college in [place]. Feeling "somewhat lost in the marriage". Feels counseling may be helpful. Request information is she decides to use.  Patient's last menstrual period was 11/11/2015.          Sexually active: Yes.    The current method of family planning is post menopausal status.    Exercising: Yes.    walking Smoker:  no  Review of Systems  Constitutional: Negative.   HENT: Negative.   Eyes: Negative.   Respiratory: Negative.   Cardiovascular: Negative.   Gastrointestinal: Negative.   Genitourinary: Negative.   Musculoskeletal: Negative.   Skin: Negative.   Neurological: Negative.   Endo/Heme/Allergies: Negative.   Psychiatric/Behavioral: Negative.     Health Maintenance: Pap:  04-15-15 neg, 04-27-17 neg History of Abnormal Pap: no MMG:  12-21-17 category c density birads 1:neg Self Breast exams: no Colonoscopy:  2018 f/u 43yrs BMD:   none TDaP:  2011 Shingles: 2019 Pneumonia: not done Hep C and HIV: Hep c neg 2018 Labs: no   reports that she has never smoked. She has never used smokeless tobacco. She reports previous alcohol use. She reports that she does not use drugs.  Past Medical History:  Diagnosis Date  . Gestational diabetes   . Lichen simplex chronicus     Past Surgical History:  Procedure Laterality Date  . NO PAST SURGERIES      Current Outpatient Medications  Medication Sig Dispense Refill  . Cholecalciferol (VITAMIN D3) 5000 units TABS Take 1 tablet by mouth daily.    . Ginkgo Biloba Extract 60 MG CAPS Take 1 capsule by mouth daily.    Marland Kitchen loratadine (CLARITIN) 10 MG tablet Take 10 mg by mouth daily as needed for allergies.    . Omega-3 Fatty Acids (FISH OIL PO) Take by mouth daily.    . Turmeric 500 MG  CAPS Take 1 capsule by mouth daily.     No current facility-administered medications for this visit.     Family History  Problem Relation Age of Onset  . Deep vein thrombosis Mother        on HRT  . Breast cancer Maternal Grandmother        post menopausal  . Diabetes Father   . Parkinson's disease Father   . Stomach cancer Paternal Grandmother     ROS:  Pertinent items are noted in HPI.  Otherwise, a comprehensive ROS was negative.  Exam:   BP 110/74   Pulse 64   Resp 16   Ht 5' 4.75" (1.645 m)   Wt 145 lb (65.8 kg)   LMP 11/11/2015   BMI 24.32 kg/m  Height: 5' 4.75" (164.5 cm) Ht Readings from Last 3 Encounters:  05/05/18 5' 4.75" (1.645 m)  11/07/17 5\' 6"  (1.676 m)  10/03/17 5\' 6"  (1.676 m)    General appearance: alert, cooperative and appears stated age Head: Normocephalic, without obvious abnormality, atraumatic Neck: no adenopathy, supple, symmetrical, trachea midline and thyroid normal to inspection and palpation Lungs: clear to auscultation bilaterally Breasts: normal appearance, no masses or tenderness, No nipple retraction or dimpling, No nipple discharge or bleeding, No axillary or supraclavicular adenopathy Heart: regular rate and rhythm Abdomen: soft, non-tender; no masses,  no organomegaly Extremities: extremities normal, atraumatic, no cyanosis or edema  Skin: Skin color, texture, turgor normal. No rashes or lesions Lymph nodes: Cervical, supraclavicular, and axillary nodes normal. No abnormal inguinal nodes palpated Neurologic: Grossly normal   Pelvic: External genitalia:  no lesions              Urethra:  normal appearing urethra with no masses, tenderness or lesions              Bartholin's and Skene's: normal                 Vagina: normal appearing vagina with normal color and discharge, no lesions              Cervix: multiparous appearance, no cervical motion tenderness and no lesions              Pap taken: Yes.   Bimanual Exam:  Uterus:   normal size, contour, position, consistency, mobility, non-tender and anteverted              Adnexa: normal adnexa and no mass, fullness, tenderness               Rectovaginal: Confirms               Anus:  normal sphincter tone, no lesions  Chaperone present: yes  A:  Well Woman with normal exam  Menopausal occasional vaginal dryness  Empty nest emotional issues  P:   Reviewed health and wellness pertinent to exam  Aware of need to advise if vaginal bleeding  Discussed very normal to experience this with busy life children bring to family. Encouraged to plan date night to have the time together they had when early marriage with out children. Don't wait for spouse to plan. Discussed marriage counseling if she feels is necessary. Will be called with  counselor information. Questions addressed.   Pap smear: yes  counseled on breast self exam, mammography screening, feminine hygiene, menopause, adequate intake of calcium and vitamin D, diet and exercise  return annually or prn  An After Visit Summary was printed and given to the patient.

## 2018-05-08 LAB — CYTOLOGY - PAP
Diagnosis: NEGATIVE
HPV (WINDOPATH): NOT DETECTED

## 2018-05-11 ENCOUNTER — Telehealth: Payer: Self-pay | Admitting: *Deleted

## 2018-05-11 NOTE — Telephone Encounter (Signed)
Spoke with patient. Provided contact information for Limited BrandsJulie Whitt, LCSW.  [facility] [place].  [address] [place], [place] [address] [phone]  Patient will call directly to schedule, is aware to return call to office if assistance needed.  Routing to provider for final review. Patient is agreeable to disposition. Will close encounter.

## 2018-05-11 NOTE — Telephone Encounter (Signed)
-----   Message from Deborah S Verner CholLeonard, CNM sent at 05/11/2018  8:12 AM EST ----- Please review chart and call patient with Vanessa Chase QueryJulie Whit  information for counseling. I feel she would be helpful

## 2018-06-08 ENCOUNTER — Ambulatory Visit (INDEPENDENT_AMBULATORY_CARE_PROVIDER_SITE_OTHER): Payer: 59 | Admitting: Psychology

## 2018-06-08 DIAGNOSIS — F4321 Adjustment disorder with depressed mood: Secondary | ICD-10-CM | POA: Diagnosis not present

## 2018-07-19 ENCOUNTER — Ambulatory Visit (INDEPENDENT_AMBULATORY_CARE_PROVIDER_SITE_OTHER): Payer: 59 | Admitting: Psychology

## 2018-07-19 DIAGNOSIS — F4321 Adjustment disorder with depressed mood: Secondary | ICD-10-CM | POA: Diagnosis not present

## 2018-07-27 ENCOUNTER — Ambulatory Visit (INDEPENDENT_AMBULATORY_CARE_PROVIDER_SITE_OTHER): Payer: 59 | Admitting: Psychology

## 2018-07-27 DIAGNOSIS — F4321 Adjustment disorder with depressed mood: Secondary | ICD-10-CM

## 2018-08-02 ENCOUNTER — Ambulatory Visit (INDEPENDENT_AMBULATORY_CARE_PROVIDER_SITE_OTHER): Payer: 59 | Admitting: Psychology

## 2018-08-02 DIAGNOSIS — F4321 Adjustment disorder with depressed mood: Secondary | ICD-10-CM | POA: Diagnosis not present

## 2018-08-16 ENCOUNTER — Ambulatory Visit (INDEPENDENT_AMBULATORY_CARE_PROVIDER_SITE_OTHER): Payer: 59 | Admitting: Psychology

## 2018-08-16 DIAGNOSIS — F4321 Adjustment disorder with depressed mood: Secondary | ICD-10-CM

## 2018-08-18 ENCOUNTER — Ambulatory Visit (INDEPENDENT_AMBULATORY_CARE_PROVIDER_SITE_OTHER): Payer: 59 | Admitting: Psychology

## 2018-08-18 DIAGNOSIS — F4321 Adjustment disorder with depressed mood: Secondary | ICD-10-CM | POA: Diagnosis not present

## 2018-08-30 ENCOUNTER — Ambulatory Visit (INDEPENDENT_AMBULATORY_CARE_PROVIDER_SITE_OTHER): Payer: 59 | Admitting: Psychology

## 2018-08-30 DIAGNOSIS — F4321 Adjustment disorder with depressed mood: Secondary | ICD-10-CM | POA: Diagnosis not present

## 2018-09-14 ENCOUNTER — Ambulatory Visit (INDEPENDENT_AMBULATORY_CARE_PROVIDER_SITE_OTHER): Payer: 59 | Admitting: Psychology

## 2018-09-14 DIAGNOSIS — F4321 Adjustment disorder with depressed mood: Secondary | ICD-10-CM | POA: Diagnosis not present

## 2018-10-04 ENCOUNTER — Ambulatory Visit (INDEPENDENT_AMBULATORY_CARE_PROVIDER_SITE_OTHER): Payer: 59 | Admitting: Psychology

## 2018-10-04 DIAGNOSIS — F4321 Adjustment disorder with depressed mood: Secondary | ICD-10-CM

## 2018-10-25 ENCOUNTER — Ambulatory Visit (INDEPENDENT_AMBULATORY_CARE_PROVIDER_SITE_OTHER): Payer: 59 | Admitting: Psychology

## 2018-10-25 DIAGNOSIS — F4321 Adjustment disorder with depressed mood: Secondary | ICD-10-CM

## 2018-11-09 ENCOUNTER — Ambulatory Visit: Payer: 59 | Admitting: Psychology

## 2018-11-22 ENCOUNTER — Other Ambulatory Visit: Payer: Self-pay | Admitting: Certified Nurse Midwife

## 2018-11-22 DIAGNOSIS — Z0000 Encounter for general adult medical examination without abnormal findings: Secondary | ICD-10-CM

## 2018-12-28 ENCOUNTER — Other Ambulatory Visit: Payer: Self-pay

## 2018-12-28 ENCOUNTER — Ambulatory Visit (INDEPENDENT_AMBULATORY_CARE_PROVIDER_SITE_OTHER): Payer: Managed Care, Other (non HMO)

## 2018-12-28 DIAGNOSIS — Z0000 Encounter for general adult medical examination without abnormal findings: Secondary | ICD-10-CM | POA: Diagnosis not present

## 2019-05-15 ENCOUNTER — Ambulatory Visit: Payer: Managed Care, Other (non HMO) | Admitting: Certified Nurse Midwife

## 2019-05-18 ENCOUNTER — Encounter: Payer: Self-pay | Admitting: Medical-Surgical

## 2019-05-18 ENCOUNTER — Ambulatory Visit (INDEPENDENT_AMBULATORY_CARE_PROVIDER_SITE_OTHER): Payer: PRIVATE HEALTH INSURANCE | Admitting: Medical-Surgical

## 2019-05-18 DIAGNOSIS — F4321 Adjustment disorder with depressed mood: Secondary | ICD-10-CM | POA: Diagnosis not present

## 2019-05-18 MED ORDER — CLONAZEPAM 0.5 MG PO TABS: 0.2500 mg | ORAL_TABLET | Freq: Every day | ORAL | 0 refills | Status: DC | PRN

## 2019-05-18 MED ORDER — SERTRALINE HCL 50 MG PO TABS: 50.0000 mg | ORAL_TABLET | Freq: Every day | ORAL | 3 refills | Status: DC

## 2019-05-18 NOTE — Progress Notes (Addendum)
Virtual Visit via Video Note  I connected with Vanessa Chase on 05/18/19 at  3:00 PM EST by a video enabled telemedicine application and verified that I am speaking with the correct person using two identifiers.   I discussed the limitations of evaluation and management by telemedicine and the availability of in person appointments. The patient expressed understanding and agreed to proceed.  Subjective:    CC: Anxiety/depression  HPI: 54 year old female presenting today with reports of increased anxiety/depression.  Her father passed away on June 04, 2019 after a very quick, unexpected decline in condition.  He passed at home under hospice care.  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  Since her father passed, there has been an increase in family stress in addition to difficulties with funeral arrangements related to Covid.  Not sleeping well, poor appetite, increased fatigue.  Has been in counseling with Teofilo Pod with but did miss 2 weeks of therapy due to the situation with her father.  Denies SI/HI.  No prior treatment for anxiety/depression.   Past medical history, Surgical history, Family history not pertinant except as noted below, Social history, Allergies, and medications have been entered into the medical record, reviewed, and corrections made.   Review of Systems: No fevers, chills, night sweats, weight loss, chest pain, or shortness of breath.   Objective:    General: Speaking clearly in complete sentences without any shortness of breath.  Alert and oriented x3.  Normal judgment.  Very emotional during conversation.  No apparent acute distress.    Impression and Recommendations:    Grief reaction - Sertraline 50 mg tabs-take one half tab daily x7 days then increase to 1 tab daily. -Short course of Klonopin 0.25-0.5mg  daily as needed. -Resume weekly therapy sessions.  Return in about 2 weeks (around  06/01/2019) for Anxiety/depression follow-up.    I discussed the assessment and treatment plan with the patient. The patient was provided an opportunity to ask questions and all were answered. The patient agreed with the plan and demonstrated an understanding of the instructions.   The patient was advised to call back or seek an in-person evaluation if the symptoms worsen or if the condition fails to improve as anticipated.  35 minutes of non-face-to-face time was provided during this encounter.  Thayer Ohm, DNP, APRN, FNP-BC [facility]

## 2019-06-05 ENCOUNTER — Telehealth (INDEPENDENT_AMBULATORY_CARE_PROVIDER_SITE_OTHER): Payer: PRIVATE HEALTH INSURANCE | Admitting: Family Medicine

## 2019-06-05 DIAGNOSIS — F419 Anxiety disorder, unspecified: Secondary | ICD-10-CM | POA: Diagnosis not present

## 2019-06-05 DIAGNOSIS — F4321 Adjustment disorder with depressed mood: Secondary | ICD-10-CM

## 2019-06-05 NOTE — Progress Notes (Signed)
Virtual Visit via Video Note  I connected with Marvia Pickles on 06/06/19 at  4:00 PM EST by a video enabled telemedicine application and verified that I am speaking with the correct person using two identifiers.   I discussed the limitations of evaluation and management by telemedicine and the availability of in person appointments. The patient expressed understanding and agreed to proceed.  Subjective:    CC: F/U Mood/New start Sertraline.   HPI: 54 year old female following for acute grief reaction in addition to some mild anxiety.  She was actually seen by one of my partners about 2 weeks ago right after her dad passed away from complications of Parkinson's disease.  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  She was really feeling very stressed at that time.  Since then she has really been missing him and there is been a little bit of tension in the family.  She does see [name] one of our therapist here in the office and she had actually recommended her to reach out and discuss possible treatment options.  She was started on sertraline.  She is now up to 50 mg which she has been taking for about a week and a half now and has thus far tolerated it well without any side effects or problems.  She was also given a small prescription for clonazepam to use sparingly and she says she still has several left but did use it fairly frequently the week of her father's funeral.  They did have a very small service for him.  She does feel like overall she is doing okay.  She said she finally went back to work because just sitting at home was actually making things worse she said she is had a lot of support at work which has been great.   Past medical history, Surgical history, Family history not pertinant except as noted below, Social history, Allergies, and medications have been entered into the medical record, reviewed, and corrections made.    Review of Systems: No fevers, chills, night sweats, weight loss, chest pain, or shortness of breath.   Objective:    General: Speaking clearly in complete sentences without any shortness of breath.  Alert and oriented x3.  Normal judgment. No apparent acute distress.    Impression and Recommendations:   Acute grief reaction-discussed that this can take time for most individuals and that no medication will really help with the grief component but certainly we can see if we can help with some of the anxiety that she is experiencing.  She did a great job in using the clonazepam sparingly and still has several left and has not used it in well over a week.  Just reminded her about the potential for dependency and again to use it truly as a rescue medication only.    Anxiety-so far doing well on the sertraline.  Been on the 50 mg for about a week and a half.  We discussed options including increasing to 100 mg early next week or just continuing with the 50 mg.  Either way had like to follow back up with her virtually in about 2 weeks just to make that sure that she is doing okay.  I am happy to hear that she is back at work and feels like she has some good support.  Continue therapy/counseling with [name].   Time spent 35 minutes in encounter.  I discussed the assessment and treatment plan with the patient. The patient was provided an opportunity to ask questions and all were answered. The patient agreed with the plan and demonstrated an understanding of the instructions.   The patient was advised to call back or seek an in-person evaluation if the symptoms worsen or if the condition fails to improve as anticipated.   Beatrice Lecher, MD

## 2019-06-06 ENCOUNTER — Telehealth: Payer: Self-pay | Admitting: Family Medicine

## 2019-06-06 ENCOUNTER — Encounter: Payer: Self-pay | Admitting: Family Medicine

## 2019-06-06 NOTE — Telephone Encounter (Signed)
Please schedule in 2 weeks for virtual visit.

## 2019-06-07 ENCOUNTER — Other Ambulatory Visit: Payer: Self-pay

## 2019-06-11 ENCOUNTER — Encounter: Payer: Self-pay | Admitting: Certified Nurse Midwife

## 2019-06-11 ENCOUNTER — Ambulatory Visit (INDEPENDENT_AMBULATORY_CARE_PROVIDER_SITE_OTHER): Payer: PRIVATE HEALTH INSURANCE | Admitting: Certified Nurse Midwife

## 2019-06-11 ENCOUNTER — Other Ambulatory Visit (HOSPITAL_COMMUNITY)
Admission: RE | Admit: 2019-06-11 | Discharge: 2019-06-11 | Disposition: A | Discharge status: Home / Self Care | Payer: PRIVATE HEALTH INSURANCE | Source: Ambulatory Visit | Attending: Certified Nurse Midwife | Admitting: Certified Nurse Midwife

## 2019-06-11 ENCOUNTER — Other Ambulatory Visit: Payer: Self-pay

## 2019-06-11 VITALS — BP 110/70 | HR 68 | Temp 98.1°F | Resp 16 | Ht 65.0 in | Wt 143.0 lb

## 2019-06-11 DIAGNOSIS — Z0000 Encounter for general adult medical examination without abnormal findings: Secondary | ICD-10-CM

## 2019-06-11 DIAGNOSIS — Z01419 Encounter for gynecological examination (general) (routine) without abnormal findings: Secondary | ICD-10-CM

## 2019-06-11 DIAGNOSIS — Z124 Encounter for screening for malignant neoplasm of cervix: Secondary | ICD-10-CM

## 2019-06-11 DIAGNOSIS — N952 Postmenopausal atrophic vaginitis: Secondary | ICD-10-CM

## 2019-06-11 DIAGNOSIS — Z23 Encounter for immunization: Secondary | ICD-10-CM | POA: Diagnosis not present

## 2019-06-11 LAB — POCT URINALYSIS DIPSTICK
Bilirubin, UA: NEGATIVE
Blood, UA: NEGATIVE
Glucose, UA: NEGATIVE
Ketones, UA: NEGATIVE
Leukocytes, UA: NEGATIVE
Nitrite, UA: NEGATIVE
Protein, UA: NEGATIVE
Urobilinogen, UA: NEGATIVE E.U./dL — AB
pH, UA: 5 (ref 5.0–8.0)

## 2019-06-11 NOTE — Patient Instructions (Signed)
EXERCISE AND DIET:  We recommended that you start or continue a regular exercise program for good health. Regular exercise means any activity that makes your heart beat faster and makes you sweat.  We recommend exercising at least 30 minutes per day at least 3 days a week, preferably 4 or 5.  We also recommend a diet low in fat and sugar.  Inactivity, poor dietary choices and obesity can cause diabetes, heart attack, stroke, and kidney damage, among others.   ° °ALCOHOL AND SMOKING:  Women should limit their alcohol intake to no more than 7 drinks/beers/glasses of wine (combined, not each!) per week. Moderation of alcohol intake to this level decreases your risk of breast cancer and liver damage. And of course, no recreational drugs are part of a healthy lifestyle.  And absolutely no smoking or even second hand smoke. Most people know smoking can cause heart and lung diseases, but did you know it also contributes to weakening of your bones? Aging of your skin?  Yellowing of your teeth and nails? ° °CALCIUM AND VITAMIN D:  Adequate intake of calcium and Vitamin D are recommended.  The recommendations for exact amounts of these supplements seem to change often, but generally speaking 600 mg of calcium (either carbonate or citrate) and 800 units of Vitamin D per day seems prudent. Certain women may benefit from higher intake of Vitamin D.  If you are among these women, your doctor will have told you during your visit.   ° °PAP SMEARS:  Pap smears, to check for cervical cancer or precancers,  have traditionally been done yearly, although recent scientific advances have shown that most women can have pap smears less often.  However, every woman still should have a physical exam from her gynecologist every year. It will include a breast check, inspection of the vulva and vagina to check for abnormal growths or skin changes, a visual exam of the cervix, and then an exam to evaluate the size and shape of the uterus and  ovaries.  And after 56 years of age, a rectal exam is indicated to check for rectal cancers. We will also provide age appropriate advice regarding health maintenance, like when you should have certain vaccines, screening for sexually transmitted diseases, bone density testing, colonoscopy, mammograms, etc.  ° °MAMMOGRAMS:  All women over 40 years old should have a yearly mammogram. Many facilities now offer a "3D" mammogram, which may cost around $50 extra out of pocket. If possible,  we recommend you accept the option to have the 3D mammogram performed.  It both reduces the number of women who will be called back for extra views which then turn out to be normal, and it is better than the routine mammogram at detecting truly abnormal areas.   ° °COLONOSCOPY:  Colonoscopy to screen for colon cancer is recommended for all women at age 50.  We know, you hate the idea of the prep.  We agree, BUT, having colon cancer and not knowing it is worse!!  Colon cancer so often starts as a polyp that can be seen and removed at colonscopy, which can quite literally save your life!  And if your first colonoscopy is normal and you have no family history of colon cancer, most women don't have to have it again for 10 years.  Once every ten years, you can do something that may end up saving your life, right?  We will be happy to help you get it scheduled when you are ready.    Be sure to check your insurance coverage so you understand how much it will cost.  It may be covered as a preventative service at no cost, but you should check your particular policy.   ° ° ° °Atrophic Vaginitis °Atrophic vaginitis is a condition in which the tissues that line the vagina become dry and thin. This condition occurs in women who have stopped having their period. It is caused by a drop in a female hormone (estrogen). This hormone helps: °· To keep the vagina moist. °· To make a clear fluid. This clear fluid helps: °? To make the vagina ready for  sex. °? To protect the vagina from infection. °If the lining of the vagina is dry and thin, it may cause irritation, burning, or itchiness. It may also: °· Make sex painful. °· Make an exam of your vagina painful. °· Cause bleeding. °· Make you lose interest in sex. °· Cause a burning feeling when you pee (urinate). °· Cause a brown or yellow fluid to come from your vagina. °Some women do not have symptoms. °Follow these instructions at home: °Medicines °· Take over-the-counter and prescription medicines only as told by your doctor. °· Do not use herbs or other medicines unless your doctor says it is okay. °· Use medicines for for dryness. These include: °? Oils to make the vagina soft. °? Creams. °? Moisturizers. °General instructions °· Do not douche. °· Do not use products that can make your vagina dry. These include: °? Scented sprays. °? Scented tampons. °? Scented soaps. °· Sex can help increase blood flow and soften the tissue in the vagina. If it hurts to have sex: °? Tell your partner. °? Use products to make sex more comfortable. Use these only as told by your doctor. °Contact a doctor if you: °· Have discharge from the vagina that is different than usual. °· Have a bad smell coming from your vagina. °· Have new symptoms. °· Do not get better. °· Get worse. °Summary °· Atrophic vaginitis is a condition in which the lining of the vagina becomes dry and thin. °· This condition affects women who have stopped having their periods. °· Treatment may include using products that help make the vagina soft. °· Call a doctor if do not get better with treatment. °This information is not intended to replace advice given to you by your health care provider. Make sure you discuss any questions you have with your health care provider. °Document Revised: 04/25/2017 Document Reviewed: 04/25/2017 °Elsevier Patient Education © 2020 [employer]vier Inc. ° °

## 2019-06-11 NOTE — Progress Notes (Signed)
54 y.o. [id number] Married  Caucasian Fe here for annual exam. Menopausal no HRT. Denies vaginal bleeding or vaginal dryness. 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 Daughters doing well. One will graduate from [employer] this spring! Sees PCP Dr. Madilyn Fireman for  Klonopin and Zoloft management for anxiety/depression and labs. Now in christian counseling for marriage and feeling good about this. Recent death of father and coping "OK". Was caring for both parents prior to death. No other health issues today.  Patient's last menstrual period was 11/11/2015.          Sexually active: Yes.    The current method of family planning is post menopausal status.    Exercising: No.  exercise Smoker:  no  Review of Systems  Constitutional: Negative.   HENT: Negative.   Eyes: Negative.   Respiratory: Negative.   Cardiovascular: Negative.   Gastrointestinal: Negative.   Genitourinary: Negative.   Musculoskeletal: Negative.   Skin: Negative.   Neurological: Negative.   Endo/Heme/Allergies: Negative.   Psychiatric/Behavioral: Negative.     Health Maintenance: Pap:  04-15-15 neg, 04-27-17 neg, 05-05-2018 neg HPV HR neg History of Abnormal Pap: no MMG:  12-29-2018 category c density birads 1:neg Self Breast exams: occ Colonoscopy:  2018 f/u 31yrs BMD:   none TDaP:  2011 Shingles: 2019  Pneumonia: not done Hep C and HIV: hep c neg 2018 Labs: poct urine-neg   reports that she has never smoked. She has never used smokeless tobacco. She reports previous alcohol use. She reports that she does not use drugs.  Past Medical History:  Diagnosis Date  . Anxiety   . Depression   . Gestational diabetes   . Lichen simplex chronicus     Past Surgical History:  Procedure Laterality Date  . NO PAST SURGERIES      Current Outpatient Medications  Medication Sig Dispense Refill  . Cholecalciferol (VITAMIN D3) 5000 units TABS Take 1 tablet by mouth daily.    . clonazePAM (KLONOPIN) 0.5 MG tablet  Take 0.5 mg by mouth as needed for anxiety.    . Ginkgo Biloba Extract 60 MG CAPS Take 1 capsule by mouth daily.    Marland Kitchen loratadine (CLARITIN) 10 MG tablet Take 10 mg by mouth daily as needed for allergies.    . Omega-3 Fatty Acids (FISH OIL PO) Take by mouth daily.    . sertraline (ZOLOFT) 50 MG tablet Take 1 tablet (50 mg total) by mouth daily. Take 1/2 tab daily x 1 week then increase to 1 tab daily. 30 tablet 3  . Turmeric 500 MG CAPS Take 1 capsule by mouth as needed.      No current facility-administered medications for this visit.    Family History  Problem Relation Age of Onset  . Deep vein thrombosis Mother        on HRT  . Breast cancer Maternal Grandmother        post menopausal  . Diabetes Father   . Parkinson's disease Father   . Stomach cancer Paternal Grandmother     ROS:  Pertinent items are noted in HPI.  Otherwise, a comprehensive ROS was negative.  Exam:   BP 110/70   Pulse 68   Temp 98.1 F (36.7 C) (Skin)   Resp 16   Ht 5\' 5"  (1.651 m)   Wt 143 lb (64.9 kg)   LMP 11/11/2015   BMI 23.80 kg/m  Height: 5\' 5"  (165.1 cm) Ht Readings from Last 3 Encounters:  06/11/19 5\' 5"  (1.651  m)  05/05/18 5' 4.75" (1.645 m)  11/07/17 5\' 6"  (1.676 m)    General appearance: alert, cooperative and appears stated age Head: Normocephalic, without obvious abnormality, atraumatic Neck: no adenopathy, supple, symmetrical, trachea midline and thyroid normal to inspection and palpation Lungs: clear to auscultation bilaterally Breasts: normal appearance, no masses or tenderness, No nipple retraction or dimpling, No nipple discharge or bleeding, No axillary or supraclavicular adenopathy Heart: regular rate and rhythm Abdomen: soft, non-tender; no masses,  no organomegaly Extremities: extremities normal, atraumatic, no cyanosis or edema Skin: Skin color, texture, turgor normal. No rashes or lesions Lymph nodes: Cervical, supraclavicular, and axillary nodes normal. No abnormal  inguinal nodes palpated Neurologic: Grossly normal   Pelvic: External genitalia:  no lesions,normal female              Urethra:  normal appearing urethra with no masses, tenderness or lesions              Bartholin's and Skene's: normal                 Vagina: normal appearing vagina with normal color and discharge, no lesions              Cervix: multiparous appearance and no cervical motion tenderness              Pap taken: No. Bimanual Exam:  Uterus:  normal size, contour, position, consistency, mobility, non-tender and anteverted              Adnexa: normal adnexa and no mass, fullness, tenderness               Rectovaginal: Confirms               Anus:  normal sphincter tone, no lesions  Chaperone present: yes  A:  Well Woman with normal exam  Menopausal no HRT  Marital counseling going well, now sexually active again  Atrophic vaginitis,Post menopausal bleeding ?  Anxiety and depression management with MD  Social stress with father's death  Immunization due TDAP  P:   Reviewed health and wellness pertinent to exam  Aware of need to advise if vaginal bleeding.  Continue with MD regarding medication management.  Discussed with patient etiology of atrophic vaginitis and need to use coconut oil nightly and prior to sexual activity. Do feel this was post menopausal bleeding due to appearance. Will do pap smear and if indicated will do PMB evaluation which would be PUS and endometrial biopsy. Patient to advise if occurs again.  Encouraged to seek Hospice Grief counseling for self and mother if needed  Requests TDAP  Pap smear: no   counseled on breast self exam, mammography screening, feminine hygiene, menopause, adequate intake of calcium and vitamin D, diet and exercise  return annually or prn  An After Visit Summary was printed and given to the patient.

## 2019-06-13 ENCOUNTER — Ambulatory Visit (INDEPENDENT_AMBULATORY_CARE_PROVIDER_SITE_OTHER): Payer: PRIVATE HEALTH INSURANCE | Admitting: Professional

## 2019-06-13 DIAGNOSIS — F411 Generalized anxiety disorder: Secondary | ICD-10-CM

## 2019-06-13 DIAGNOSIS — F331 Major depressive disorder, recurrent, moderate: Secondary | ICD-10-CM

## 2019-06-13 LAB — CYTOLOGY - PAP
Comment: NEGATIVE
Diagnosis: NEGATIVE
High risk HPV: NEGATIVE

## 2019-06-19 ENCOUNTER — Telehealth (INDEPENDENT_AMBULATORY_CARE_PROVIDER_SITE_OTHER): Payer: PRIVATE HEALTH INSURANCE | Admitting: Family Medicine

## 2019-06-19 ENCOUNTER — Encounter: Payer: Self-pay | Admitting: Family Medicine

## 2019-06-19 DIAGNOSIS — F4321 Adjustment disorder with depressed mood: Secondary | ICD-10-CM

## 2019-06-19 NOTE — Progress Notes (Signed)
Virtual Visit via Video Note  I connected with Vanessa Chase on 06/19/19 at  3:20 PM EST by a video enabled telemedicine application and verified that I am speaking with the correct person using two identifiers.   I discussed the limitations of evaluation and management by telemedicine and the availability of in person appointments. The patient expressed understanding and agreed to proceed.  Subjective:    CC: F/U Grief Reaction   HPI: Follow-up acute grief reaction.  She is currently on sertraline and doing well.  Also using the clonazepam very sparingly. She decided to stay on 50mg  sertraline. She says the first week that she went to a whole tab she noticed a few symptoms such as feeling some nausea and some jitteriness.  She said after about 4 5 days it actually seemed to resolve and she feels like in the last week or so it is really kicked in and been really helpful.  She denies any other current negative side effects. Currently engaged in therapy/counseling with .  He is still back at work and doing well.  Past medical history, Surgical history, Family history not pertinant except as noted below, Social history, Allergies, and medications have been entered into the medical record, reviewed, and corrections made.   Review of Systems: No fevers, chills, night sweats, weight loss, chest pain, or shortness of breath.   Objective:    General: Speaking clearly in complete sentences without any shortness of breath.  Alert and oriented x3.  Normal judgment. No apparent acute distress.    Impression and Recommendations:    No problem-specific Assessment & Plan notes found for this encounter.   Grief Reaction -10 you with regular weekly therapy sessions with Vanessa Chase.  Continue with 50 mg of sertraline for now.  In the next 2 to 3 weeks if she decides she wants to go up please let me know.  She can either just call and give Vanessa Chase a my chart note.  That way she will have been on it for at  least a full 6 weeks at that point to have a better sense of the complete efficacy of the medication.  Otherwise we will plan to follow-up in about 2 months we can do a virtual visit if she would like.    Time spent in encounter 22 minutes  I discussed the assessment and treatment plan with the patient. The patient was provided an opportunity to ask questions and all were answered. The patient agreed with the plan and demonstrated an understanding of the instructions.   The patient was advised to call back or seek an in-person evaluation if the symptoms worsen or if the condition fails to improve as anticipated.   [place], MD

## 2019-06-19 NOTE — Progress Notes (Signed)
Doing well on current regimen. She did have some hot flashes,jittery,and nausea after the 2nd week of taking this. She would eat something or drink ginger ale when this would happen and feels that this has gotten into her system now.

## 2019-06-20 ENCOUNTER — Ambulatory Visit (INDEPENDENT_AMBULATORY_CARE_PROVIDER_SITE_OTHER): Payer: PRIVATE HEALTH INSURANCE | Admitting: Professional

## 2019-06-20 DIAGNOSIS — F331 Major depressive disorder, recurrent, moderate: Secondary | ICD-10-CM | POA: Diagnosis not present

## 2019-06-27 ENCOUNTER — Ambulatory Visit (INDEPENDENT_AMBULATORY_CARE_PROVIDER_SITE_OTHER): Payer: PRIVATE HEALTH INSURANCE | Admitting: Professional

## 2019-06-27 DIAGNOSIS — F331 Major depressive disorder, recurrent, moderate: Secondary | ICD-10-CM | POA: Diagnosis not present

## 2019-06-27 DIAGNOSIS — F411 Generalized anxiety disorder: Secondary | ICD-10-CM

## 2019-07-04 ENCOUNTER — Ambulatory Visit (INDEPENDENT_AMBULATORY_CARE_PROVIDER_SITE_OTHER): Payer: PRIVATE HEALTH INSURANCE | Admitting: Professional

## 2019-07-04 DIAGNOSIS — F331 Major depressive disorder, recurrent, moderate: Secondary | ICD-10-CM | POA: Diagnosis not present

## 2019-07-11 ENCOUNTER — Ambulatory Visit (INDEPENDENT_AMBULATORY_CARE_PROVIDER_SITE_OTHER): Payer: PRIVATE HEALTH INSURANCE | Admitting: Professional

## 2019-07-11 DIAGNOSIS — F411 Generalized anxiety disorder: Secondary | ICD-10-CM | POA: Diagnosis not present

## 2019-07-11 DIAGNOSIS — F331 Major depressive disorder, recurrent, moderate: Secondary | ICD-10-CM | POA: Diagnosis not present

## 2019-07-18 ENCOUNTER — Encounter: Payer: Self-pay | Admitting: Certified Nurse Midwife

## 2019-07-18 ENCOUNTER — Ambulatory Visit: Payer: PRIVATE HEALTH INSURANCE | Admitting: Professional

## 2019-07-25 ENCOUNTER — Ambulatory Visit (INDEPENDENT_AMBULATORY_CARE_PROVIDER_SITE_OTHER): Payer: PRIVATE HEALTH INSURANCE | Admitting: Professional

## 2019-07-25 DIAGNOSIS — F331 Major depressive disorder, recurrent, moderate: Secondary | ICD-10-CM | POA: Diagnosis not present

## 2019-07-25 DIAGNOSIS — F411 Generalized anxiety disorder: Secondary | ICD-10-CM

## 2019-08-01 ENCOUNTER — Ambulatory Visit: Payer: PRIVATE HEALTH INSURANCE | Admitting: Professional

## 2019-08-08 ENCOUNTER — Ambulatory Visit (INDEPENDENT_AMBULATORY_CARE_PROVIDER_SITE_OTHER): Payer: PRIVATE HEALTH INSURANCE | Admitting: Professional

## 2019-08-08 DIAGNOSIS — F331 Major depressive disorder, recurrent, moderate: Secondary | ICD-10-CM

## 2019-08-08 DIAGNOSIS — F411 Generalized anxiety disorder: Secondary | ICD-10-CM | POA: Diagnosis not present

## 2019-08-15 ENCOUNTER — Ambulatory Visit: Payer: PRIVATE HEALTH INSURANCE | Admitting: Professional

## 2019-08-21 ENCOUNTER — Encounter: Payer: Self-pay | Admitting: Family Medicine

## 2019-08-21 ENCOUNTER — Telehealth (INDEPENDENT_AMBULATORY_CARE_PROVIDER_SITE_OTHER): Payer: PRIVATE HEALTH INSURANCE | Admitting: Family Medicine

## 2019-08-21 VITALS — Ht 65.0 in | Wt 138.0 lb

## 2019-08-21 DIAGNOSIS — F422 Mixed obsessional thoughts and acts: Secondary | ICD-10-CM | POA: Insufficient documentation

## 2019-08-21 DIAGNOSIS — F4321 Adjustment disorder with depressed mood: Secondary | ICD-10-CM

## 2019-08-21 DIAGNOSIS — F4320 Adjustment disorder, unspecified: Secondary | ICD-10-CM | POA: Insufficient documentation

## 2019-08-21 MED ORDER — SERTRALINE HCL 50 MG PO TABS: 50.0000 mg | ORAL_TABLET | Freq: Every day | ORAL | 1 refills | Status: DC

## 2019-08-21 NOTE — Progress Notes (Signed)
Virtual Visit via Video Note  I connected with Vanessa Chase on 08/21/19 at  3:40 PM EDT by a video enabled telemedicine application and verified that I am speaking with the correct person using two identifiers.   I discussed the limitations of evaluation and management by telemedicine and the availability of in person appointments. The patient expressed understanding and agreed to proceed.  Subjective:    CC: Grief   HPI: Follow-up grief reaction-she is actually doing really really well.  She says the sertraline has really been a Publishing copy for her she feels more focused at work.  She says that when stressful situations have, particular with her sister and mother things have actually gone really well she is been able to handle them well without feeling overwhelmed or upset.  She still continuing to work with [name] for therapy and says they have actually started to space her visits because she has been doing so well.  She did want to let me know that she does have some OCD behaviors with checking things around the house particularly before she leaves in the mornings.  She actually feels like that has been a little bit more manageable with the medication and with some help from [place] on some strategies around helping with that.  She also let me know that she did go to her OB/GYN in February, Debbie Leonard with [facility].  She got an up-to-date exam as well as her tetanus.   Past medical history, Surgical history, Family history not pertinant except as noted below, Social history, Allergies, and medications have been entered into the medical record, reviewed, and corrections made.   Review of Systems: No fevers, chills, night sweats, weight loss, chest pain, or shortness of breath.   Objective:    General: Speaking clearly in complete sentences without any shortness of breath.  Alert and oriented x3.  Normal judgment. No apparent acute distress.    Impression and  Recommendations:    No problem-specific Assessment & Plan notes found for this encounter.  Grief reaction-overall she is really doing well and has adjusted well continue with therapy with [name] and also discussed options and regarding her medication.  We will continue her sertraline at the 50 mg dose for now and plan to follow-up in 3 months.   OCD-he has noticed improvement in her OCD symptoms which I was not aware of previously.  And she is been working with [name] on some management strategies.  We did discuss that if she continues to struggle with this we could always increase her sertraline as typically higher doses of SSRIs are more effective for controlling OCD symptoms  So encouraged her to schedule a physical this summer so that we can get some updated blood work.    Time spent in encounter 21 minutes  I discussed the assessment and treatment plan with the patient. The patient was provided an opportunity to ask questions and all were answered. The patient agreed with the plan and demonstrated an understanding of the instructions.   The patient was advised to call back or seek an in-person evaluation if the symptoms worsen or if the condition fails to improve as anticipated.   Nani Gasser, MD

## 2019-08-21 NOTE — Progress Notes (Signed)
Pt is doing well on current regimen. It is helping her to focus more and helping her with her relationships with her mother and sister that she's not dwelling on this. It has helped with her anxiety.    She would like to continue on her current dosing.

## 2019-08-22 ENCOUNTER — Ambulatory Visit (INDEPENDENT_AMBULATORY_CARE_PROVIDER_SITE_OTHER): Payer: PRIVATE HEALTH INSURANCE | Admitting: Professional

## 2019-08-22 DIAGNOSIS — F411 Generalized anxiety disorder: Secondary | ICD-10-CM | POA: Diagnosis not present

## 2019-08-22 DIAGNOSIS — F331 Major depressive disorder, recurrent, moderate: Secondary | ICD-10-CM | POA: Diagnosis not present

## 2019-08-29 ENCOUNTER — Ambulatory Visit: Payer: PRIVATE HEALTH INSURANCE | Admitting: Professional

## 2019-09-05 ENCOUNTER — Ambulatory Visit (INDEPENDENT_AMBULATORY_CARE_PROVIDER_SITE_OTHER): Payer: PRIVATE HEALTH INSURANCE | Admitting: Professional

## 2019-09-05 DIAGNOSIS — F331 Major depressive disorder, recurrent, moderate: Secondary | ICD-10-CM

## 2019-09-05 DIAGNOSIS — F411 Generalized anxiety disorder: Secondary | ICD-10-CM | POA: Diagnosis not present

## 2019-09-19 ENCOUNTER — Ambulatory Visit (INDEPENDENT_AMBULATORY_CARE_PROVIDER_SITE_OTHER): Payer: PRIVATE HEALTH INSURANCE | Admitting: Professional

## 2019-09-19 DIAGNOSIS — F411 Generalized anxiety disorder: Secondary | ICD-10-CM | POA: Diagnosis not present

## 2019-09-19 DIAGNOSIS — F331 Major depressive disorder, recurrent, moderate: Secondary | ICD-10-CM

## 2019-10-08 ENCOUNTER — Ambulatory Visit (INDEPENDENT_AMBULATORY_CARE_PROVIDER_SITE_OTHER): Payer: PRIVATE HEALTH INSURANCE | Admitting: Professional

## 2019-10-08 DIAGNOSIS — F411 Generalized anxiety disorder: Secondary | ICD-10-CM | POA: Diagnosis not present

## 2019-10-08 DIAGNOSIS — F331 Major depressive disorder, recurrent, moderate: Secondary | ICD-10-CM | POA: Diagnosis not present

## 2019-10-22 ENCOUNTER — Ambulatory Visit (INDEPENDENT_AMBULATORY_CARE_PROVIDER_SITE_OTHER): Payer: PRIVATE HEALTH INSURANCE | Admitting: Professional

## 2019-10-22 DIAGNOSIS — F331 Major depressive disorder, recurrent, moderate: Secondary | ICD-10-CM | POA: Diagnosis not present

## 2019-10-22 DIAGNOSIS — F411 Generalized anxiety disorder: Secondary | ICD-10-CM | POA: Diagnosis not present

## 2019-10-31 ENCOUNTER — Ambulatory Visit: Payer: PRIVATE HEALTH INSURANCE | Admitting: Professional

## 2019-11-07 ENCOUNTER — Ambulatory Visit: Payer: PRIVATE HEALTH INSURANCE | Admitting: Professional

## 2019-11-14 ENCOUNTER — Ambulatory Visit (INDEPENDENT_AMBULATORY_CARE_PROVIDER_SITE_OTHER): Payer: PRIVATE HEALTH INSURANCE | Admitting: Professional

## 2019-11-14 DIAGNOSIS — F411 Generalized anxiety disorder: Secondary | ICD-10-CM

## 2019-11-14 DIAGNOSIS — F331 Major depressive disorder, recurrent, moderate: Secondary | ICD-10-CM | POA: Diagnosis not present

## 2019-11-20 ENCOUNTER — Ambulatory Visit: Payer: PRIVATE HEALTH INSURANCE | Admitting: Family Medicine

## 2019-11-21 ENCOUNTER — Ambulatory Visit: Payer: PRIVATE HEALTH INSURANCE | Admitting: Professional

## 2019-11-23 ENCOUNTER — Other Ambulatory Visit: Payer: Self-pay | Admitting: Family Medicine

## 2019-11-23 DIAGNOSIS — Z1231 Encounter for screening mammogram for malignant neoplasm of breast: Secondary | ICD-10-CM

## 2019-11-27 ENCOUNTER — Encounter: Payer: Self-pay | Admitting: Family Medicine

## 2019-11-27 ENCOUNTER — Ambulatory Visit (INDEPENDENT_AMBULATORY_CARE_PROVIDER_SITE_OTHER): Payer: PRIVATE HEALTH INSURANCE | Admitting: Family Medicine

## 2019-11-27 ENCOUNTER — Telehealth: Payer: Self-pay | Admitting: Family Medicine

## 2019-11-27 ENCOUNTER — Other Ambulatory Visit: Payer: Self-pay

## 2019-11-27 VITALS — BP 112/59 | HR 59 | Ht 65.0 in | Wt 142.0 lb

## 2019-11-27 DIAGNOSIS — Z8639 Personal history of other endocrine, nutritional and metabolic disease: Secondary | ICD-10-CM

## 2019-11-27 DIAGNOSIS — L659 Nonscarring hair loss, unspecified: Secondary | ICD-10-CM

## 2019-11-27 DIAGNOSIS — Z01419 Encounter for gynecological examination (general) (routine) without abnormal findings: Secondary | ICD-10-CM | POA: Diagnosis not present

## 2019-11-27 DIAGNOSIS — F422 Mixed obsessional thoughts and acts: Secondary | ICD-10-CM

## 2019-11-27 DIAGNOSIS — F4321 Adjustment disorder with depressed mood: Secondary | ICD-10-CM

## 2019-11-27 DIAGNOSIS — Z0000 Encounter for general adult medical examination without abnormal findings: Secondary | ICD-10-CM

## 2019-11-27 MED ORDER — SERTRALINE HCL 100 MG PO TABS: 100.0000 mg | ORAL_TABLET | Freq: Every day | ORAL | 0 refills | Status: DC

## 2019-11-27 NOTE — Telephone Encounter (Signed)
Labs ordered. These are to be done fasting

## 2019-11-27 NOTE — Telephone Encounter (Signed)
Patient notified.      No further questions

## 2019-11-27 NOTE — Addendum Note (Signed)
Addended by: Jed Limerick on: 11/27/2019 02:23 PM   Modules accepted: Orders

## 2019-11-27 NOTE — Telephone Encounter (Signed)
PT requested bloodwork to be put in. Expected to come in next week to get it done.   Please advise.

## 2019-11-27 NOTE — Assessment & Plan Note (Signed)
To new routine therapy with [name].  Doing well on sertraline.  Still having some relationship struggles with her mother and sister.

## 2019-11-27 NOTE — Assessment & Plan Note (Signed)
Discussed that typically takes higher doses of SSRIs to actually be effective for OCD behaviors.  Will increase sertraline to 100 mg.  Like to see her back in about 6 to 8 weeks.  Continue routine therapy with [name].

## 2019-11-27 NOTE — Progress Notes (Addendum)
Established Patient Office Visit  Subjective:  Patient ID: Vanessa Chase, female    DOB: Nov 25, 1965  Age: 54 y.o. MRN: [id number]  CC:  Chief Complaint  Patient presents with  . mood     HPI Vanessa Chase presents for f/u for her grief and her OCD.   Overall she is actually been doing really well.  She is currently on sertraline 50 mg.  Still struggling some though since her father passed away.  She says he recently on vacation and that really helped reduce her stress levels.  She still seeing [name] and her therapist once a month now and that is actually been really helpful.  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  Again she still struggling a little bit with some of the OCD behaviors and is wondering if going up on the sertraline might actually be helpful.  Also would like a referral to OB/GYN for routine care.  Past Medical History:  Diagnosis Date  . Anxiety   . Depression   . Gestational diabetes   . Lichen simplex chronicus     Past Surgical History:  Procedure Laterality Date  . NO PAST SURGERIES      Family History  Problem Relation Age of Onset  . Deep vein thrombosis Mother        on HRT  . Breast cancer Maternal Grandmother        post menopausal  . Diabetes Father   . Parkinson's disease Father   . Stomach cancer Paternal Grandmother     Social History   Socioeconomic History  . Marital status: Married    Spouse name: Not on file  . Number of children: Not on file  . Years of education: Not on file  . Highest education level: Not on file  Occupational History  . Occupation: pre-K Runner, broadcasting/film/video   Tobacco Use  . Smoking status: Never Smoker  . Smokeless tobacco: Never Used  Vaping Use  . Vaping Use: Never used  Substance and Sexual Activity  . Alcohol use: Not Currently  . Drug use: No  . Sexual activity: Yes    Partners: Male    Birth control/protection: Post-menopausal  Other Topics Concern  . Not on file  Social  History Narrative  . Not on file   Social Determinants of Health   Financial Resource Strain:   . Difficulty of Paying Living Expenses:   Food Insecurity:   . Worried About Programme researcher, broadcasting/film/video in the Last Year:   . Barista in the Last Year:   Transportation Needs:   . Freight forwarder (Medical):   Marland Kitchen Lack of Transportation (Non-Medical):   Physical Activity:   . Days of Exercise per Week:   . Minutes of Exercise per Session:   Stress:   . Feeling of Stress :   Social Connections:   . Frequency of Communication with Friends and Family:   . Frequency of Social Gatherings with Friends and Family:   . Attends Religious Services:   . Active Member of Clubs or Organizations:   . Attends Banker Meetings:   Marland Kitchen Marital Status:   Intimate Partner Violence:   . Fear of Current or Ex-Partner:   . Emotionally Abused:   Marland Kitchen Physically Abused:   . Sexually Abused:     Outpatient Medications Prior to Visit  Medication Sig Dispense Refill  . Cholecalciferol (VITAMIN D3) 5000 units TABS Take 1 tablet  by mouth daily.    Marland Kitchen loratadine (CLARITIN) 10 MG tablet Take 10 mg by mouth daily as needed for allergies.    . Omega-3 Fatty Acids (FISH OIL PO) Take by mouth daily.    . Turmeric 500 MG CAPS Take 1 capsule by mouth as needed.     . Ginkgo Biloba Extract 60 MG CAPS Take 1 capsule by mouth daily.    . sertraline (ZOLOFT) 50 MG tablet Take 1 tablet (50 mg total) by mouth daily. 90 tablet 1   No facility-administered medications prior to visit.    Allergies  Allergen Reactions  . Betadine [Povidone Iodine]     hives  . Nitrofurantoin     REACTION: Rash, SOB  . Povidone-Iodine     REACTION: Allergic reaction    ROS Review of Systems    Objective:    Physical Exam Constitutional:      Appearance: She is well-developed.  HENT:     Head: Normocephalic and atraumatic.  Cardiovascular:     Rate and Rhythm: Normal rate and regular rhythm.     Heart sounds:  Normal heart sounds.  Pulmonary:     Effort: Pulmonary effort is normal.     Breath sounds: Normal breath sounds.  Skin:    General: Skin is warm and dry.  Neurological:     Mental Status: She is alert and oriented to person, place, and time.  Psychiatric:        Behavior: Behavior normal.     BP (!) 112/59   Pulse (!) 59   Ht 5\' 5"  (1.651 m)   Wt 142 lb (64.4 kg)   LMP 11/11/2015   SpO2 100%   BMI 23.63 kg/m  Wt Readings from Last 3 Encounters:  11/27/19 142 lb (64.4 kg)  08/21/19 138 lb (62.6 kg)  06/11/19 143 lb (64.9 kg)     Health Maintenance Due  Topic Date Due  . INFLUENZA VACCINE  11/25/2019    There are no preventive care reminders to display for this patient.  Lab Results  Component Value Date   TSH 1.860 04/27/2017   Lab Results  Component Value Date   WBC 3.4 (L) 10/03/2017   HGB 13.4 10/03/2017   HCT 38.9 10/03/2017   MCV 89.2 10/03/2017   PLT 171 10/03/2017   Lab Results  Component Value Date   NA 141 04/27/2017   K 4.4 04/27/2017   CO2 24 04/27/2017   GLUCOSE 84 04/27/2017   BUN 14 04/27/2017   CREATININE 0.67 04/27/2017   BILITOT 1.6 (H) 04/27/2017   ALKPHOS 97 04/27/2017   AST 27 04/27/2017   ALT 28 04/27/2017   PROT 7.0 04/27/2017   ALBUMIN 4.9 04/27/2017   CALCIUM 9.8 04/27/2017   Lab Results  Component Value Date   CHOL 239 (H) 04/27/2017   Lab Results  Component Value Date   HDL 102 04/27/2017   Lab Results  Component Value Date   LDLCALC 130 (H) 04/27/2017   Lab Results  Component Value Date   TRIG 36 04/27/2017   Lab Results  Component Value Date   CHOLHDL 2.3 04/27/2017   No results found for: HGBA1C    Assessment & Plan:   Problem List Items Addressed This Visit      Other   Mixed obsessional thoughts and acts    Discussed that typically takes higher doses of SSRIs to actually be effective for OCD behaviors.  Will increase sertraline to 100 mg.  Like to see  her back in about 6 to 8 weeks.  Continue  routine therapy with [name].      Grief reaction - Primary    To new routine therapy with [name].  Doing well on sertraline.  Still having some relationship struggles with her mother and sister.      Relevant Medications   sertraline (ZOLOFT) 100 MG tablet    Other Visit Diagnoses    Encounter for gynecological examination without abnormal finding       Relevant Orders   Ambulatory referral to Obstetrics / Gynecology      Rounded that she is due for mammogram next month.  Meds ordered this encounter  Medications  . sertraline (ZOLOFT) 100 MG tablet    Sig: Take 1 tablet (100 mg total) by mouth daily.    Dispense:  90 tablet    Refill:  0    Follow-up: Return in about 5 weeks (around 01/02/2020) for CPE .    Nani Gasser, MD

## 2019-12-01 LAB — CBC WITH DIFFERENTIAL/PLATELET
Absolute Monocytes: 311 cells/uL (ref 200–950)
Basophils Absolute: 52 cells/uL (ref 0–200)
Basophils Relative: 1.4 %
Eosinophils Absolute: 100 cells/uL (ref 15–500)
Eosinophils Relative: 2.7 %
HCT: 40 % (ref 35.0–45.0)
Hemoglobin: 13.8 g/dL (ref 11.7–15.5)
Lymphs Abs: 1243 cells/uL (ref 850–3900)
MCH: 31.4 pg (ref 27.0–33.0)
MCHC: 34.5 g/dL (ref 32.0–36.0)
MCV: 90.9 fL (ref 80.0–100.0)
MPV: 13.1 fL — ABNORMAL HIGH (ref 7.5–12.5)
Monocytes Relative: 8.4 %
Neutro Abs: 1994 cells/uL (ref 1500–7800)
Neutrophils Relative %: 53.9 %
Platelets: 148 10*3/uL (ref 140–400)
RBC: 4.4 10*6/uL (ref 3.80–5.10)
RDW: 12.1 % (ref 11.0–15.0)
Total Lymphocyte: 33.6 %
WBC: 3.7 10*3/uL — ABNORMAL LOW (ref 3.8–10.8)

## 2019-12-01 LAB — COMPLETE METABOLIC PANEL WITH GFR
AG Ratio: 2 (calc) (ref 1.0–2.5)
ALT: 10 U/L (ref 6–29)
AST: 15 U/L (ref 10–35)
Albumin: 4.5 g/dL (ref 3.6–5.1)
Alkaline phosphatase (APISO): 86 U/L (ref 37–153)
BUN: 15 mg/dL (ref 7–25)
CO2: 29 mmol/L (ref 20–32)
Calcium: 9.6 mg/dL (ref 8.6–10.4)
Chloride: 104 mmol/L (ref 98–110)
Creat: 0.67 mg/dL (ref 0.50–1.05)
GFR, Est African American: 115 mL/min/{1.73_m2} (ref >=60)
GFR, Est Non African American: 99 mL/min/{1.73_m2} (ref >=60)
Globulin: 2.3 g/dL (calc) (ref 1.9–3.7)
Glucose, Bld: 98 mg/dL (ref 65–99)
Potassium: 5 mmol/L (ref 3.5–5.3)
Sodium: 141 mmol/L (ref 135–146)
Total Bilirubin: 1 mg/dL (ref 0.2–1.2)
Total Protein: 6.8 g/dL (ref 6.1–8.1)

## 2019-12-01 LAB — LIPID PANEL W/REFLEX DIRECT LDL
Cholesterol: 231 mg/dL — ABNORMAL HIGH (ref <200)
HDL: 80 mg/dL (ref >=50)
LDL Cholesterol (Calc): 137 mg/dL (calc) — ABNORMAL HIGH
Non-HDL Cholesterol (Calc): 151 mg/dL (calc) — ABNORMAL HIGH (ref <130)
Total CHOL/HDL Ratio: 2.9 (calc) (ref <5.0)
Triglycerides: 47 mg/dL (ref <150)

## 2019-12-12 ENCOUNTER — Ambulatory Visit: Payer: PRIVATE HEALTH INSURANCE | Admitting: Professional

## 2020-01-01 ENCOUNTER — Encounter: Payer: Self-pay | Admitting: Family Medicine

## 2020-01-01 ENCOUNTER — Other Ambulatory Visit: Payer: Self-pay

## 2020-01-01 ENCOUNTER — Ambulatory Visit (INDEPENDENT_AMBULATORY_CARE_PROVIDER_SITE_OTHER): Payer: PRIVATE HEALTH INSURANCE | Admitting: Family Medicine

## 2020-01-01 ENCOUNTER — Ambulatory Visit (INDEPENDENT_AMBULATORY_CARE_PROVIDER_SITE_OTHER): Payer: PRIVATE HEALTH INSURANCE

## 2020-01-01 VITALS — BP 111/56 | HR 66 | Ht 65.0 in | Wt 138.0 lb

## 2020-01-01 DIAGNOSIS — Z0000 Encounter for general adult medical examination without abnormal findings: Secondary | ICD-10-CM

## 2020-01-01 DIAGNOSIS — M25572 Pain in left ankle and joints of left foot: Secondary | ICD-10-CM | POA: Diagnosis not present

## 2020-01-01 NOTE — Progress Notes (Signed)
Subjective:     Vanessa Chase is a 54 y.o. female and is here for a comprehensive physical exam. The patient reports no problems. Over the summer she has been eating healthy and exercising. Her husbands cholesterol came back high and they have been working to improve his numbers and she has been eating a mediterranean diet as well.   Also reports that about 6 weeks ago she was walking on the beach when a big rock washed up and hit her medial ankle on the left side she says been sore and tender and swelling ever since then.  She has been able to walk on it.  Social History   Socioeconomic History   Marital status: Married    Spouse name: Not on file   Number of children: Not on file   Years of education: Not on file   Highest education level: Not on file  Occupational History   Occupation: pre-K teacher   Tobacco Use   Smoking status: Never Smoker   Smokeless tobacco: Never Used  Building services engineer Use: Never used  Substance and Sexual Activity   Alcohol use: Not Currently   Drug use: No   Sexual activity: Yes    Partners: Male    Birth control/protection: Post-menopausal  Other Topics Concern   Not on file  Social History Narrative   Not on file   Social Determinants of Health   Financial Resource Strain:    Difficulty of Paying Living Expenses: Not on file  Food Insecurity:    Worried About Programme researcher, broadcasting/film/video in the Last Year: Not on file   [employer] of Food in the Last Year: Not on file  Transportation Needs:    Lack of Transportation (Medical): Not on file   Lack of Transportation (Non-Medical): Not on file  Physical Activity:    Days of Exercise per Week: Not on file   Minutes of Exercise per Session: Not on file  Stress:    Feeling of Stress : Not on file  Social Connections:    Frequency of Communication with Friends and Family: Not on file   Frequency of Social Gatherings with Friends and Family: Not on file   Attends Religious  Services: Not on file   Active Member of Clubs or Organizations: Not on file   Attends Banker Meetings: Not on file   Marital Status: Not on file  Intimate Partner Violence:    Fear of Current or Ex-Partner: Not on file   Emotionally Abused: Not on file   Physically Abused: Not on file   Sexually Abused: Not on file   Health Maintenance  Topic Date Due   INFLUENZA VACCINE  07/24/2020 (Originally 11/25/2019)   MAMMOGRAM  12/27/2020   PAP SMEAR-Modifier  06/10/2022   COLONOSCOPY  07/03/2026   TETANUS/TDAP  06/10/2029   COVID-19 Vaccine  Completed   Hepatitis C Screening  Completed   HIV Screening  Discontinued    The following portions of the patient's history were reviewed and updated as appropriate: allergies, current medications, past family history, past medical history, past social history, past surgical history and problem list.  Review of Systems A comprehensive review of systems was negative.   Objective:    BP (!) 111/56    Pulse 66    Ht 5\' 5"  (1.651 m)    Wt 138 lb (62.6 kg)    LMP 11/11/2015    SpO2 99%    BMI 22.96 kg/m  General appearance: alert, cooperative and appears stated age Head: Normocephalic, without obvious abnormality, atraumatic Eyes: conj clear, EOMI, PEERLA Ears: normal TM's and external ear canals both ears Nose: Nares normal. Septum midline. Mucosa normal. No drainage or sinus tenderness. Throat: lips, mucosa, and tongue normal; teeth and gums normal Neck: no adenopathy, no carotid bruit, no JVD, supple, symmetrical, trachea midline and thyroid not enlarged, symmetric, no tenderness/mass/nodules Back: symmetric, no curvature. ROM normal. No CVA tenderness. Lungs: clear to auscultation bilaterally Breasts: normal appearance, no masses or tenderness Heart: regular rate and rhythm, S1, S2 normal, no murmur, click, rub or gallop Abdomen: soft, non-tender; bowel sounds normal; no masses,  no organomegaly Extremities:  extremities normal, atraumatic, no cyanosis or edema Pulses: 2+ and symmetric Skin: Skin color, texture, turgor normal. No rashes or lesions Lymph nodes: Cervical, supraclavicular, and axillary nodes normal. Neurologic: Alert and oriented X 3, normal strength and tone. Normal symmetric reflexes. Normal coordination and gait    Assessment:    Healthy female exam.      Plan:     See After Visit Summary for Counseling Recommendations   Keep up a regular exercise program and make sure you are eating a healthy diet Try to eat 4 servings of dairy a day, or if you are lactose intolerant take a calcium with vitamin D daily.  Your vaccines are up to date.  We reviewed her labs together today.   Left medial ankle pain she has evident swelling on exam and tenderness directly over the medial malleolus.  Will get x-ray to rule out fracture.  Recommend a trial of anti-inflammatory over the next couple weeks and if not improving then please let me know.  The 10-year ASCVD risk score Denman George DC Montez Hageman., et al., 2013) is: 1.2%   Values used to calculate the score:     Age: 84 years     Sex: Female     Is Non-Hispanic African American: No     Diabetic: No     Tobacco smoker: No     Systolic Blood Pressure: 111 mmHg     Is BP treated: No     HDL Cholesterol: 80 mg/dL     Total Cholesterol: 231 mg/dL

## 2020-01-01 NOTE — Patient Instructions (Signed)
Health Maintenance, Female Adopting a healthy lifestyle and getting preventive care are important in promoting health and wellness. Ask your health care provider about:  The right schedule for you to have regular tests and exams.  Things you can do on your own to prevent diseases and keep yourself healthy. What should I know about diet, weight, and exercise? Eat a healthy diet   Eat a diet that includes plenty of vegetables, fruits, low-fat dairy products, and lean protein.  Do not eat a lot of foods that are high in solid fats, added sugars, or sodium. Maintain a healthy weight Body mass index (BMI) is used to identify weight problems. It estimates body fat based on height and weight. Your health care provider can help determine your BMI and help you achieve or maintain a healthy weight. Get regular exercise Get regular exercise. This is one of the most important things you can do for your health. Most adults should:  Exercise for at least 150 minutes each week. The exercise should increase your heart rate and make you sweat (moderate-intensity exercise).  Do strengthening exercises at least twice a week. This is in addition to the moderate-intensity exercise.  Spend less time sitting. Even light physical activity can be beneficial. Watch cholesterol and blood lipids Have your blood tested for lipids and cholesterol at 56 years of age, then have this test every 5 years. Have your cholesterol levels checked more often if:  Your lipid or cholesterol levels are high.  You are older than 56 years of age.  You are at high risk for heart disease. What should I know about cancer screening? Depending on your health history and family history, you may need to have cancer screening at various ages. This may include screening for:  Breast cancer.  Cervical cancer.  Colorectal cancer.  Skin cancer.  Lung cancer. What should I know about heart disease, diabetes, and high blood  pressure? Blood pressure and heart disease  High blood pressure causes heart disease and increases the risk of stroke. This is more likely to develop in people who have high blood pressure readings, are of African descent, or are overweight.  Have your blood pressure checked: ? Every 3-5 years if you are 18-39 years of age. ? Every year if you are 40 years old or older. Diabetes Have regular diabetes screenings. This checks your fasting blood sugar level. Have the screening done:  Once every three years after age 40 if you are at a normal weight and have a low risk for diabetes.  More often and at a younger age if you are overweight or have a high risk for diabetes. What should I know about preventing infection? Hepatitis B If you have a higher risk for hepatitis B, you should be screened for this virus. Talk with your health care provider to find out if you are at risk for hepatitis B infection. Hepatitis C Testing is recommended for:  Everyone born from 1945 through 1965.  Anyone with known risk factors for hepatitis C. Sexually transmitted infections (STIs)  Get screened for STIs, including gonorrhea and chlamydia, if: ? You are sexually active and are younger than 56 years of age. ? You are older than 56 years of age and your health care provider tells you that you are at risk for this type of infection. ? Your sexual activity has changed since you were last screened, and you are at increased risk for chlamydia or gonorrhea. Ask your health care provider if   you are at risk.  Ask your health care provider about whether you are at high risk for HIV. Your health care provider may recommend a prescription medicine to help prevent HIV infection. If you choose to take medicine to prevent HIV, you should first get tested for HIV. You should then be tested every 3 months for as long as you are taking the medicine. Pregnancy  If you are about to stop having your period (premenopausal) and  you may become pregnant, seek counseling before you get pregnant.  Take 400 to 800 micrograms (mcg) of folic acid every day if you become pregnant.  Ask for birth control (contraception) if you want to prevent pregnancy. Osteoporosis and menopause Osteoporosis is a disease in which the bones lose minerals and strength with aging. This can result in bone fractures. If you are 65 years old or older, or if you are at risk for osteoporosis and fractures, ask your health care provider if you should:  Be screened for bone loss.  Take a calcium or vitamin D supplement to lower your risk of fractures.  Be given hormone replacement therapy (HRT) to treat symptoms of menopause. Follow these instructions at home: Lifestyle  Do not use any products that contain nicotine or tobacco, such as cigarettes, e-cigarettes, and chewing tobacco. If you need help quitting, ask your health care provider.  Do not use street drugs.  Do not share needles.  Ask your health care provider for help if you need support or information about quitting drugs. Alcohol use  Do not drink alcohol if: ? Your health care provider tells you not to drink. ? You are pregnant, may be pregnant, or are planning to become pregnant.  If you drink alcohol: ? Limit how much you use to 0-1 drink a day. ? Limit intake if you are breastfeeding.  Be aware of how much alcohol is in your drink. In the [place] one drink equals one 12 oz bottle of beer (355 mL), one 5 oz glass of wine (148 mL), or one 1 oz glass of hard liquor (44 mL). General instructions  Schedule regular health, dental, and eye exams.  Stay current with your vaccines.  Tell your health care provider if: ? You often feel depressed. ? You have ever been abused or do not feel safe at home. Summary  Adopting a healthy lifestyle and getting preventive care are important in promoting health and wellness.  Follow your health care provider's instructions about healthy  diet, exercising, and getting tested or screened for diseases.  Follow your health care provider's instructions on monitoring your cholesterol and blood pressure. This information is not intended to replace advice given to you by your health care provider. Make sure you discuss any questions you have with your health care provider. Document Revised: 04/05/2018 Document Reviewed: 04/05/2018 Elsevier Patient Education  2020 Elsevier Inc.  

## 2020-01-02 ENCOUNTER — Ambulatory Visit (INDEPENDENT_AMBULATORY_CARE_PROVIDER_SITE_OTHER): Payer: PRIVATE HEALTH INSURANCE

## 2020-01-02 DIAGNOSIS — Z1231 Encounter for screening mammogram for malignant neoplasm of breast: Secondary | ICD-10-CM

## 2020-01-09 ENCOUNTER — Ambulatory Visit (INDEPENDENT_AMBULATORY_CARE_PROVIDER_SITE_OTHER): Payer: PRIVATE HEALTH INSURANCE | Admitting: Professional

## 2020-01-09 DIAGNOSIS — F331 Major depressive disorder, recurrent, moderate: Secondary | ICD-10-CM | POA: Diagnosis not present

## 2020-01-09 DIAGNOSIS — F411 Generalized anxiety disorder: Secondary | ICD-10-CM | POA: Diagnosis not present

## 2020-02-06 ENCOUNTER — Ambulatory Visit (INDEPENDENT_AMBULATORY_CARE_PROVIDER_SITE_OTHER): Payer: PRIVATE HEALTH INSURANCE | Admitting: Professional

## 2020-02-06 DIAGNOSIS — F331 Major depressive disorder, recurrent, moderate: Secondary | ICD-10-CM | POA: Diagnosis not present

## 2020-02-06 DIAGNOSIS — F411 Generalized anxiety disorder: Secondary | ICD-10-CM | POA: Diagnosis not present

## 2020-02-17 ENCOUNTER — Other Ambulatory Visit: Payer: Self-pay | Admitting: Family Medicine

## 2020-02-17 DIAGNOSIS — F4321 Adjustment disorder with depressed mood: Secondary | ICD-10-CM

## 2020-06-23 ENCOUNTER — Ambulatory Visit (INDEPENDENT_AMBULATORY_CARE_PROVIDER_SITE_OTHER): Payer: PRIVATE HEALTH INSURANCE | Admitting: Obstetrics & Gynecology

## 2020-06-23 ENCOUNTER — Other Ambulatory Visit: Payer: Self-pay

## 2020-06-23 ENCOUNTER — Encounter: Payer: Self-pay | Admitting: Obstetrics & Gynecology

## 2020-06-23 VITALS — BP 103/63 | HR 66 | Resp 16 | Ht 65.0 in | Wt 135.0 lb

## 2020-06-23 DIAGNOSIS — Z01419 Encounter for gynecological examination (general) (routine) without abnormal findings: Secondary | ICD-10-CM | POA: Diagnosis not present

## 2020-06-23 NOTE — Progress Notes (Signed)
Last pap 06/11/19- normal Last mam- 01/04/20- normal No concerns today

## 2020-06-23 NOTE — Progress Notes (Signed)
Subjective:     Vanessa Chase is a 55 y.o. female here for a routine exam.  Current complaints: hot flashes--about 1/day.  Worse with stress.  No PMB.  Patient is a Emergency planning/management officer at [employer].  She has some acne from mask wearing.   Gynecologic History Patient's last menstrual period was 11/11/2015. Contraception: post menopausal status Last Pap: 2020. Results were: normal Last mammogram: 2021. Results were: normal  Obstetric History OB History  Gravida Para Term Preterm AB Living  4 2     2 2   SAB IAB Ectopic Multiple Live Births  2            # Outcome Date GA Lbr Len/2nd Weight Sex Delivery Anes PTL Lv  4 SAB           3 SAB           2 Para           1 Para              The following portions of the patient's history were reviewed and updated as appropriate: allergies, current medications, past family history, past medical history, past social history, past surgical history and problem list.  Review of Systems Pertinent items noted in HPI and remainder of comprehensive ROS otherwise negative.    Objective:      Vitals:   06/23/20 1432  BP: 103/63  Pulse: 66  Resp: 16  Weight: 135 lb (61.2 kg)  Height: 5\' 5"  (1.651 m)   Vitals:  WNL General appearance: alert, cooperative and no distress  HEENT: Normocephalic, without obvious abnormality, atraumatic Eyes: negative Throat: lips, mucosa, and tongue normal; teeth and gums normal  Respiratory: Clear to auscultation bilaterally  CV: Regular rate and rhythm  Breasts:  Normal appearance, no masses or tenderness, no nipple retraction or dimpling  GI: Soft, non-tender; bowel sounds normal; no masses,  no organomegaly  GU: External Genitalia:  Tanner V, no lesion Urethra:  No prolapse   Vagina: Pink, normal rugae, no blood or discharge  Cervix: No CMT, no lesion  Uterus:  Normal size and contour, non tender  Adnexa: Normal, no masses, non tender  Musculoskeletal: No edema, redness or tenderness in the  calves or thighs  Skin: No lesions or rash  Lymphatic: Axillary adenopathy: none     Psychiatric: Normal mood and behavior        Assessment:    Healthy female exam.    Plan:   1.  Pap up to date 2.  Yearly mammograms 3.  Colonoscopy 4.  Colonoscopy normal (no polyps); q10 years. 5.  Covid and Shigles vaccine up to date.  6.  Dr. 06/25/20 manages rest of HCM. 7.  Sunscreen, seatbelts, and dental health reviewed.

## 2020-11-12 ENCOUNTER — Other Ambulatory Visit: Payer: Self-pay | Admitting: Family Medicine

## 2020-11-12 DIAGNOSIS — F4321 Adjustment disorder with depressed mood: Secondary | ICD-10-CM

## 2020-11-18 ENCOUNTER — Other Ambulatory Visit: Payer: Self-pay | Admitting: Family Medicine

## 2020-11-18 DIAGNOSIS — Z1231 Encounter for screening mammogram for malignant neoplasm of breast: Secondary | ICD-10-CM

## 2021-01-05 ENCOUNTER — Ambulatory Visit (INDEPENDENT_AMBULATORY_CARE_PROVIDER_SITE_OTHER): Payer: PRIVATE HEALTH INSURANCE | Admitting: Family Medicine

## 2021-01-05 ENCOUNTER — Encounter: Payer: Self-pay | Admitting: Family Medicine

## 2021-01-05 ENCOUNTER — Other Ambulatory Visit: Payer: Self-pay

## 2021-01-05 VITALS — BP 102/59 | HR 80 | Ht 65.0 in | Wt 133.0 lb

## 2021-01-05 DIAGNOSIS — Z0000 Encounter for general adult medical examination without abnormal findings: Secondary | ICD-10-CM

## 2021-01-05 DIAGNOSIS — L709 Acne, unspecified: Secondary | ICD-10-CM

## 2021-01-05 MED ORDER — METRONIDAZOLE 0.75 % EX GEL: 1.0000 "application " | Freq: Two times a day (BID) | CUTANEOUS | 2 refills | Status: DC

## 2021-01-05 NOTE — Progress Notes (Signed)
Established Patient Office Visit  Subjective:  Patient ID: Vanessa Chase, female    DOB: [birth year]  Age: 55 y.o. MRN: [id number]  CC:  Chief Complaint  Patient presents with   Annual Exam    HPI Vanessa Chase presents for CPE.  He has had her COVID boosters and plans on getting the new by Valent vaccine.  Is doing well on her sertraline without any problems she is happy with her regimen would like to continue with it. She is doing well.   She is struggling with red bumps and pustules on her face mostly around her mouth and chin.  She hasn't been wearing a mask for the last several months.  He says it started around the time she started wearing a mask for COVID about 2 years ago but it never really went away she says it was watch worse when she was wearing a mask.  Says sometimes they have little whiteheads on them.   Past Medical History:  Diagnosis Date   Anxiety    Depression    Gestational diabetes    Lichen simplex chronicus     Past Surgical History:  Procedure Laterality Date   NO PAST SURGERIES      Family History  Problem Relation Age of Onset   Deep vein thrombosis Mother        on HRT   Breast cancer Maternal Grandmother        post menopausal   Diabetes Father    Parkinson's disease Father    Stomach cancer Paternal Grandmother     Social History   Socioeconomic History   Marital status: Married    Spouse name: Not on file   Number of children: Not on file   Years of education: Not on file   Highest education level: Not on file  Occupational History   Occupation: pre-K Runner, broadcasting/film/video   Tobacco Use   Smoking status: Never   Smokeless tobacco: Never  Vaping Use   Vaping Use: Never used  Substance and Sexual Activity   Alcohol use: Not Currently   Drug use: No   Sexual activity: Yes    Partners: Male    Birth control/protection: Post-menopausal  Other Topics Concern   Not on file  Social History Narrative   Not on file   Social Determinants of  Health   Financial Resource Strain: Not on file  Food Insecurity: Not on file  Transportation Needs: Not on file  Physical Activity: Not on file  Stress: Not on file  Social Connections: Not on file  Intimate Partner Violence: Not on file    Outpatient Medications Prior to Visit  Medication Sig Dispense Refill   sertraline (ZOLOFT) 100 MG tablet TAKE 1 TABLET BY MOUTH EVERY DAY 90 tablet 2   Cholecalciferol (VITAMIN D3) 5000 units TABS Take 1 tablet by mouth daily. (Patient not taking: Reported on 06/23/2020)     loratadine (CLARITIN) 10 MG tablet Take 10 mg by mouth daily as needed for allergies. (Patient not taking: Reported on 06/23/2020)     Omega-3 Fatty Acids (FISH OIL PO) Take by mouth daily. (Patient not taking: Reported on 06/23/2020)     No facility-administered medications prior to visit.    Allergies  Allergen Reactions   Betadine [Povidone Iodine]     hives   Nitrofurantoin     REACTION: Rash, SOB   Povidone-Iodine     REACTION: Allergic reaction    ROS Review of Systems  Objective:    Physical Exam  BP (!) 102/59   Pulse 80   Ht 5\' 5"  (1.651 m)   Wt 133 lb (60.3 kg)   LMP 11/11/2015   SpO2 98%   BMI 22.13 kg/m  Wt Readings from Last 3 Encounters:  01/05/21 133 lb (60.3 kg)  06/23/20 135 lb (61.2 kg)  01/01/20 138 lb (62.6 kg)     There are no preventive care reminders to display for this patient.  There are no preventive care reminders to display for this patient.  Lab Results  Component Value Date   TSH 1.860 04/27/2017   Lab Results  Component Value Date   WBC 3.7 (L) 11/30/2019   HGB 13.8 11/30/2019   HCT 40.0 11/30/2019   MCV 90.9 11/30/2019   PLT 148 11/30/2019   Lab Results  Component Value Date   NA 141 11/30/2019   K 5.0 11/30/2019   CO2 29 11/30/2019   GLUCOSE 98 11/30/2019   BUN 15 11/30/2019   CREATININE 0.67 11/30/2019   BILITOT 1.0 11/30/2019   ALKPHOS 97 04/27/2017   AST 15 11/30/2019   ALT 10 11/30/2019    PROT 6.8 11/30/2019   ALBUMIN 4.9 04/27/2017   CALCIUM 9.6 11/30/2019   Lab Results  Component Value Date   CHOL 231 (H) 11/30/2019   Lab Results  Component Value Date   HDL 80 11/30/2019   Lab Results  Component Value Date   LDLCALC 137 (H) 11/30/2019   Lab Results  Component Value Date   TRIG 47 11/30/2019   Lab Results  Component Value Date   CHOLHDL 2.9 11/30/2019   No results found for: HGBA1C    Assessment & Plan:   Problem List Items Addressed This Visit   None Visit Diagnoses     Wellness examination    -  Primary   Relevant Orders   Lipid Panel w/reflex Direct LDL   COMPLETE METABOLIC PANEL WITH GFR   CBC   Adult acne       Relevant Medications   metroNIDAZOLE (METROGEL) 0.75 % gel      Keep up a regular exercise program and make sure you are eating a healthy diet Try to eat 4 servings of dairy a day, or if you are lactose intolerant take a calcium with vitamin D daily.  Your vaccines are up to date.  Labs ordered she will go when she is fasting.  Adult Acne  vs perioral dermatitis.  Stop using retinol on the are.  Recommend benzyl peroxide wash.  Apply metronidazole gel.  If too costly at the pharmacy we can try to see if they will cover the cream or the lotion.  Apply moisturizer.  If not improving over the next 8 weeks let me know she does have an appoint with dermatology but is not till February.  Consider topical clindamycin if the metronidazole is not effective.  We also discussed that she may want to monitor for triggers such as change in toothpaste etc.   Meds ordered this encounter  Medications   metroNIDAZOLE (METROGEL) 0.75 % gel    Sig: Apply 1 application topically 2 (two) times daily.    Dispense:  45 g    Refill:  2    Follow-up: Return in about 1 year (around 01/05/2022) for Wellness Exam.    03/07/2022, MD

## 2021-01-08 ENCOUNTER — Other Ambulatory Visit: Payer: Self-pay

## 2021-01-08 ENCOUNTER — Ambulatory Visit (INDEPENDENT_AMBULATORY_CARE_PROVIDER_SITE_OTHER): Payer: PRIVATE HEALTH INSURANCE

## 2021-01-08 DIAGNOSIS — Z1231 Encounter for screening mammogram for malignant neoplasm of breast: Secondary | ICD-10-CM

## 2021-01-10 LAB — COMPLETE METABOLIC PANEL WITH GFR
AG Ratio: 2.1 (calc) (ref 1.0–2.5)
ALT: 17 U/L (ref 6–29)
AST: 22 U/L (ref 10–35)
Albumin: 4.7 g/dL (ref 3.6–5.1)
Alkaline phosphatase (APISO): 76 U/L (ref 37–153)
BUN: 17 mg/dL (ref 7–25)
CO2: 31 mmol/L (ref 20–32)
Calcium: 9.6 mg/dL (ref 8.6–10.4)
Chloride: 101 mmol/L (ref 98–110)
Creat: 0.66 mg/dL (ref 0.50–1.03)
Globulin: 2.2 g/dL (calc) (ref 1.9–3.7)
Glucose, Bld: 85 mg/dL (ref 65–99)
Potassium: 4.4 mmol/L (ref 3.5–5.3)
Sodium: 139 mmol/L (ref 135–146)
Total Bilirubin: 1.2 mg/dL (ref 0.2–1.2)
Total Protein: 6.9 g/dL (ref 6.1–8.1)
eGFR: 103 mL/min/{1.73_m2} (ref >=60)

## 2021-01-10 LAB — CBC
HCT: 39.7 % (ref 35.0–45.0)
Hemoglobin: 13.5 g/dL (ref 11.7–15.5)
MCH: 31.3 pg (ref 27.0–33.0)
MCHC: 34 g/dL (ref 32.0–36.0)
MCV: 92.1 fL (ref 80.0–100.0)
MPV: 12.2 fL (ref 7.5–12.5)
Platelets: 169 10*3/uL (ref 140–400)
RBC: 4.31 10*6/uL (ref 3.80–5.10)
RDW: 11.8 % (ref 11.0–15.0)
WBC: 3.5 10*3/uL — ABNORMAL LOW (ref 3.8–10.8)

## 2021-01-10 LAB — LIPID PANEL W/REFLEX DIRECT LDL
Cholesterol: 216 mg/dL — ABNORMAL HIGH (ref <200)
HDL: 91 mg/dL (ref >=50)
LDL Cholesterol (Calc): 114 mg/dL (calc) — ABNORMAL HIGH
Non-HDL Cholesterol (Calc): 125 mg/dL (calc) (ref <130)
Total CHOL/HDL Ratio: 2.4 (calc) (ref <5.0)
Triglycerides: 37 mg/dL (ref <150)

## 2021-01-12 NOTE — Progress Notes (Signed)
Hi Vanessa Chase, your LDL cholesterol looks better its down to 114 it was 137 so great work and bringing that down that is fantastic.  HDL which is the good cholesterol also went back up which is awesome.  Metabolic panel looks great.  White blood cell count is stable.  No anemia.

## 2021-01-14 NOTE — Progress Notes (Signed)
Please call patient. Normal mammogram.  Repeat in 1 year.  

## 2021-05-02 ENCOUNTER — Encounter: Payer: Self-pay | Admitting: Family Medicine

## 2021-05-14 ENCOUNTER — Encounter: Payer: Self-pay | Admitting: Family Medicine

## 2021-05-14 DIAGNOSIS — L709 Acne, unspecified: Secondary | ICD-10-CM

## 2021-05-14 DIAGNOSIS — F4320 Adjustment disorder, unspecified: Secondary | ICD-10-CM

## 2021-05-14 DIAGNOSIS — F4321 Adjustment disorder with depressed mood: Secondary | ICD-10-CM

## 2021-05-14 MED ORDER — METRONIDAZOLE 0.75 % EX GEL: 1.0000 "application " | Freq: Two times a day (BID) | CUTANEOUS | 2 refills | Status: DC

## 2021-05-14 MED ORDER — SERTRALINE HCL 100 MG PO TABS: 100.0000 mg | ORAL_TABLET | Freq: Every day | ORAL | 2 refills | Status: DC

## 2021-07-01 NOTE — Progress Notes (Signed)
? ?ANNUAL EXAM ?Patient name: Vanessa Chase MRN [id number]  Date of birth: 07/31/1965 ?Chief Complaint:   ?Gynecologic Exam ? ?History of Present Illness:   ?Vanessa Chase is a 56 y.o. [phone] Caucasian female being seen today for a routine annual exam.  ? ?Current complaints: None. She denies PMB. No vaginal dryness but uses water based lubricant.  ? ?Patient's last menstrual period was 11/11/2015. ? ? ?The pregnancy intention screening data noted above was reviewed. Potential methods of contraception were discussed. The patient elected to proceed with No data recorded.  ? ?Last pap 05/2020. Results were: NILM w/ HRHPV negative. H/O abnormal pap: no ?Last mammogram: 12/2020. Results were: normal. Family h/o breast cancer: yes mgm, postmenopausal ?Last colonoscopy: within the last 10 years. Results were: normal. Family h/o colorectal cancer: no ? ?Depression screen [facility] 2/9 01/05/2021 08/21/2019 06/19/2019 06/05/2019 10/03/2017  ?Decreased Interest 0 0 2 3 0  ?Down, Depressed, Hopeless 0 0 0 2 0  ?PHQ - 2 Score 0 0 2 5 0  ?Altered sleeping - 0 0 0 -  ?Tired, decreased energy - 0 1 0 -  ?Change in appetite - 0 0 0 -  ?Feeling bad or failure about yourself  - 0 0 0 -  ?Trouble concentrating - 0 1 1 -  ?Moving slowly or fidgety/restless - 0 0 0 -  ?Suicidal thoughts - 0 0 0 -  ?PHQ-9 Score - 0 4 6 -  ?Difficult doing work/chores - Not difficult at all Somewhat difficult Extremely dIfficult -  ? ?  ?GAD 7 : Generalized Anxiety Score 01/05/2021 08/21/2019 06/19/2019 06/05/2019  ?Nervous, Anxious, on Edge 0 0 0 1  ?Control/stop worrying 0 0 0 2  ?Worry too much - different things 0 0 0 2  ?Trouble relaxing 0 0 0 1  ?Restless 0 0 0 0  ?Easily annoyed or irritable 0 1 0 1  ?Afraid - awful might happen 0 0 0 0  ?Total GAD 7 Score 0 1 0 7  ?Anxiety Difficulty Not difficult at all Not difficult at all Not difficult at all Somewhat difficult  ? ? ? ?Review of Systems:   ?Pertinent items are noted in HPI ?Denies any headaches, blurred  vision, fatigue, shortness of breath, chest pain, abdominal pain, abnormal vaginal discharge/itching/odor/irritation, problems with periods, bowel movements, urination, or intercourse unless otherwise stated above.  ?Pertinent History Reviewed:  ?Reviewed past medical,surgical, social and family history.  ?Reviewed problem list, medications and allergies. ?Physical Assessment:  ? ?Vitals:  ? 07/02/21 1459  ?BP: 117/61  ?Pulse: 68  ?Weight: 137 lb (62.1 kg)  ?Body mass index is 22.8 kg/m?. ?  ?Physical Examination:  ?General appearance - well appearing, and in no distress ?Mental status - alert, oriented to person, place, and time ?Psych:  She has a normal mood and affect ?Skin - warm and dry, normal color, no suspicious lesions noted ?Chest - effort normal, all lung fields clear to auscultation bilaterally ?Heart - normal rate and regular rhythm ?Neck:  midline trachea, no thyromegaly or nodules ?Breasts - breasts appear normal, no suspicious masses, no skin or nipple changes or  axillary nodes ?Abdomen - soft, nontender, nondistended, no masses or organomegaly ?Pelvic - Pt declines and is asymptomatic ?Extremities:  No swelling or varicosities noted ? ?Chaperone present for exam ? ?No results found for this or any previous visit (from the past 24 hour(s)).  ?Assessment & Plan:  ?1) Well-Woman Exam ?- Cervical cancer screening: Discussed guidelines. Pap with HPV  done and normal 05/2020 ?- Breast Health: Encouraged self breast awareness/SBE. Discussed limits of clinical breast exam for detecting breast cancer. Discussed importance of annual MXR. Reviewed modifiable risk factors and encouraged her continued efforts.  ?- Climacteric/Sexual health: Reviewed typical and atypical symptoms of menopause/peri-menopause. Discussed PMB and to call if any amount of spotting. Discussed measure to help with vaginal dryness should they develop.   ?- Bone Health: Calcium via diet and supplementation. Discussed weight bearing  exercise. DEXA due age 26-65. She will discuss with PCP ?- Colonoscopy: up to date ?- F/U 12 months and prn  ? ? ?Labs/procedures today: None ? ?Mammogram: in 1 year (September) ?Colonoscopy:  up to date ? ?No orders of the defined types were placed in this encounter. ? ? ?Meds: No orders of the defined types were placed in this encounter. ? ? ?Follow-up: No follow-ups on file. ? ?Milas Hock, MD ?07/02/2021 ?3:49 PM ?

## 2021-07-02 ENCOUNTER — Ambulatory Visit (INDEPENDENT_AMBULATORY_CARE_PROVIDER_SITE_OTHER): Payer: PRIVATE HEALTH INSURANCE | Admitting: Obstetrics and Gynecology

## 2021-07-02 ENCOUNTER — Encounter: Payer: Self-pay | Admitting: Obstetrics and Gynecology

## 2021-07-02 ENCOUNTER — Other Ambulatory Visit: Payer: Self-pay

## 2021-07-02 VITALS — BP 117/61 | HR 68 | Wt 137.0 lb

## 2021-07-02 DIAGNOSIS — Z01419 Encounter for gynecological examination (general) (routine) without abnormal findings: Secondary | ICD-10-CM | POA: Diagnosis not present

## 2021-09-09 IMAGING — MG DIGITAL SCREENING BILAT W/ TOMO W/ CAD
7 series · 8 of 23 positions shown · non-contrast
Comparison: Previous exam(s).

CLINICAL DATA: Screening.

EXAM:
DIGITAL SCREENING BILATERAL MAMMOGRAM WITH TOMO AND CAD

[L CC synth-2D]
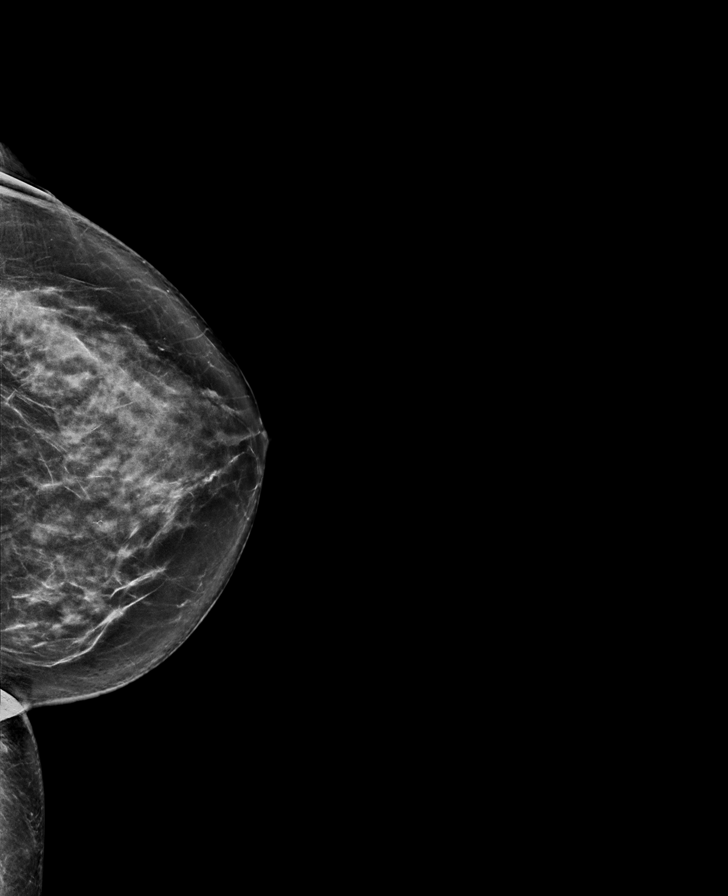

[R MLO synth-2D]
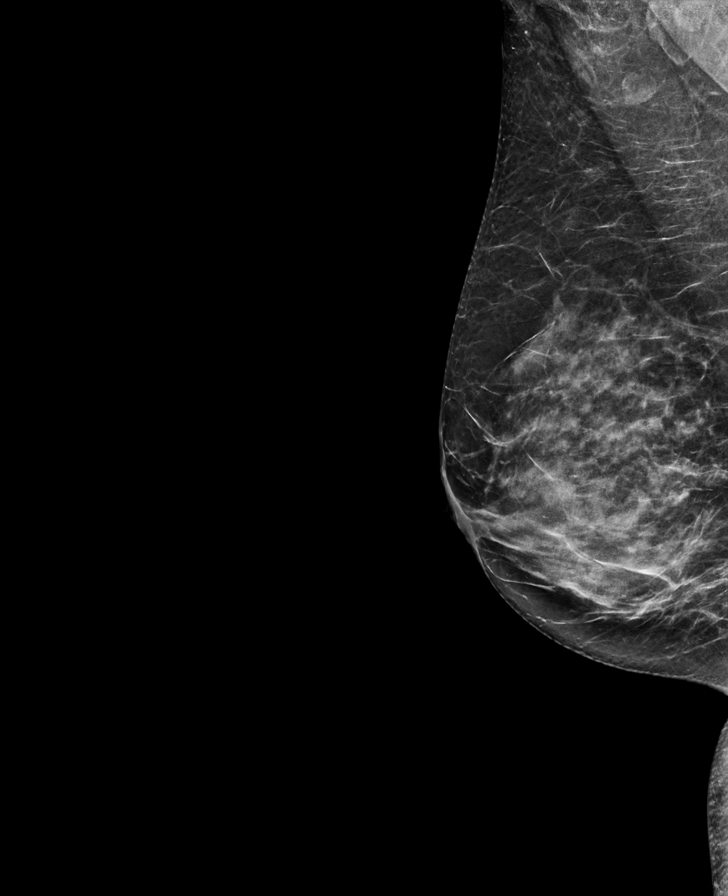

[L MLO synth-2D]
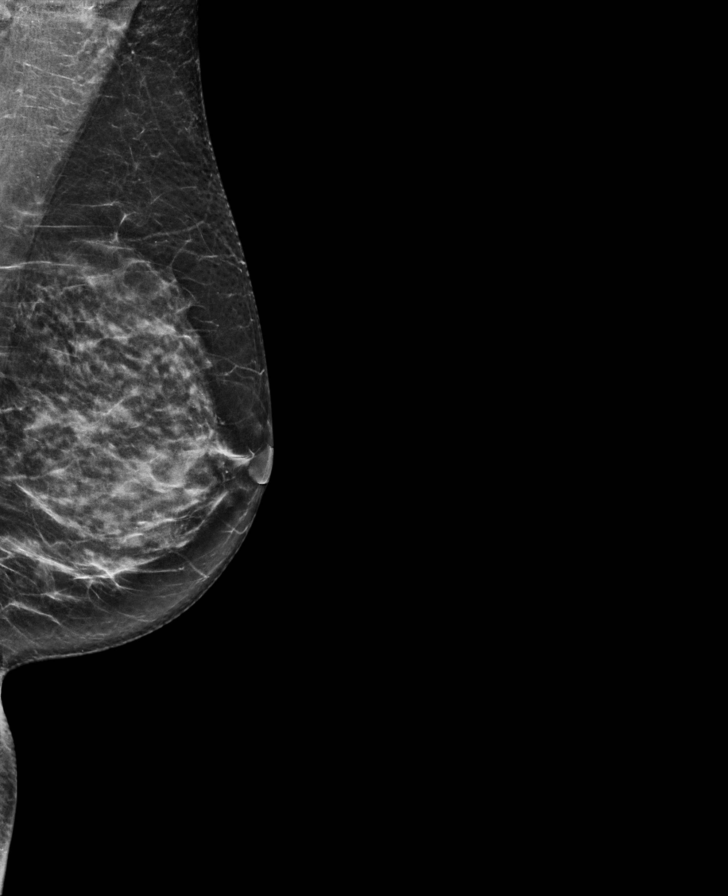

[L CC tomo · 2 of 71 frames shown]
[frame 23/71]
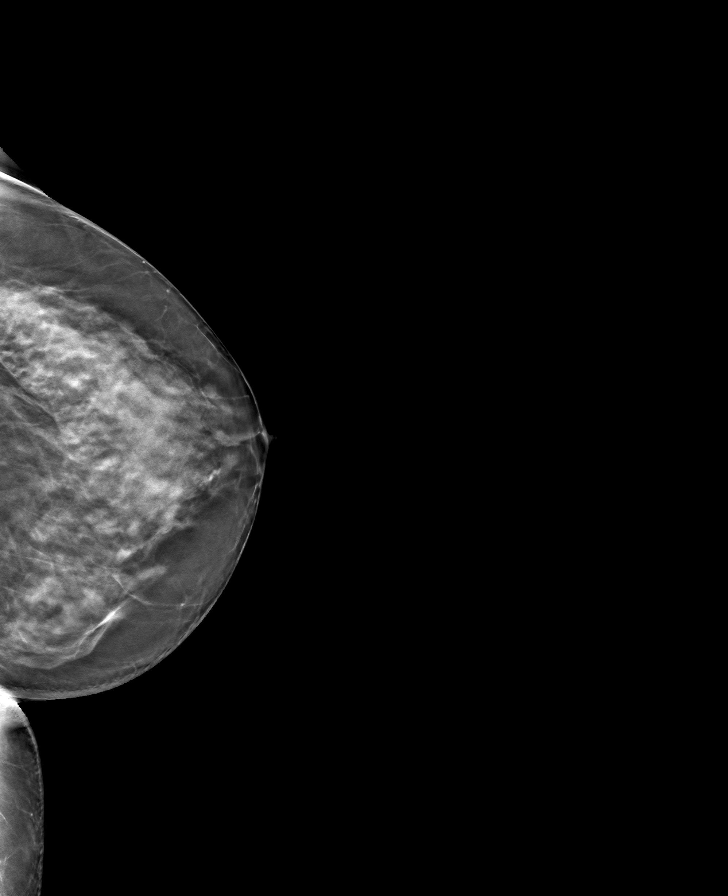
[frame 36/71]
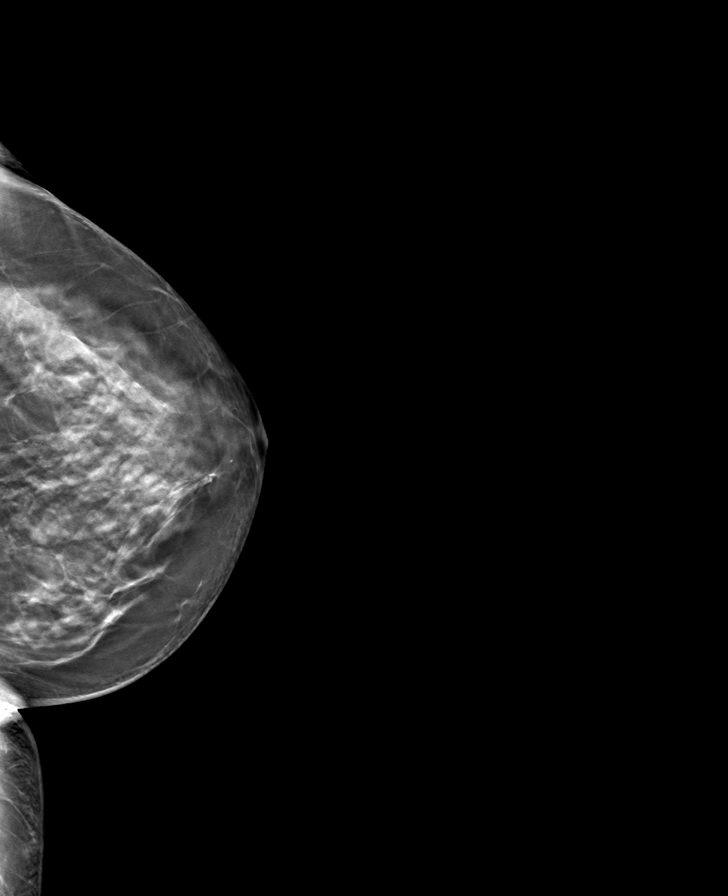

[R MLO tomo · tomo slice 32/63.0]
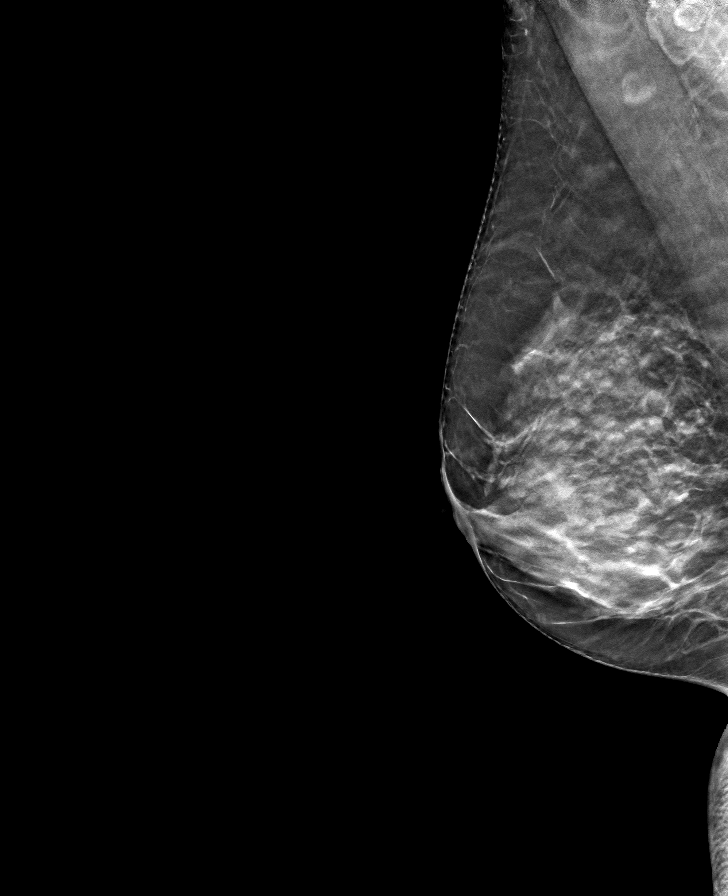

[L MLO tomo · tomo slice 32/63.0]
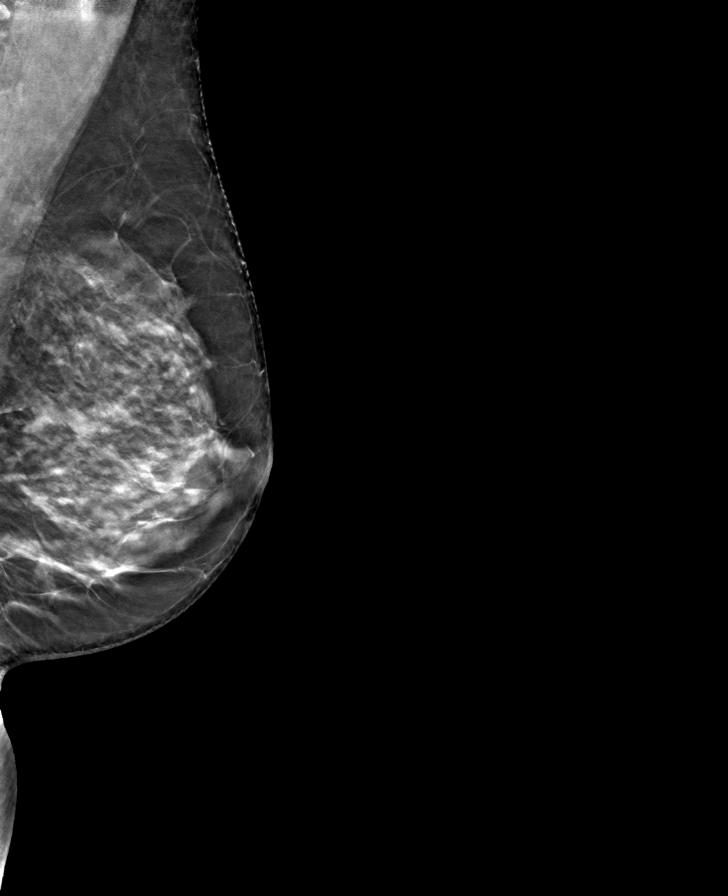

[L CV tomo · tomo slice 31/61.0]
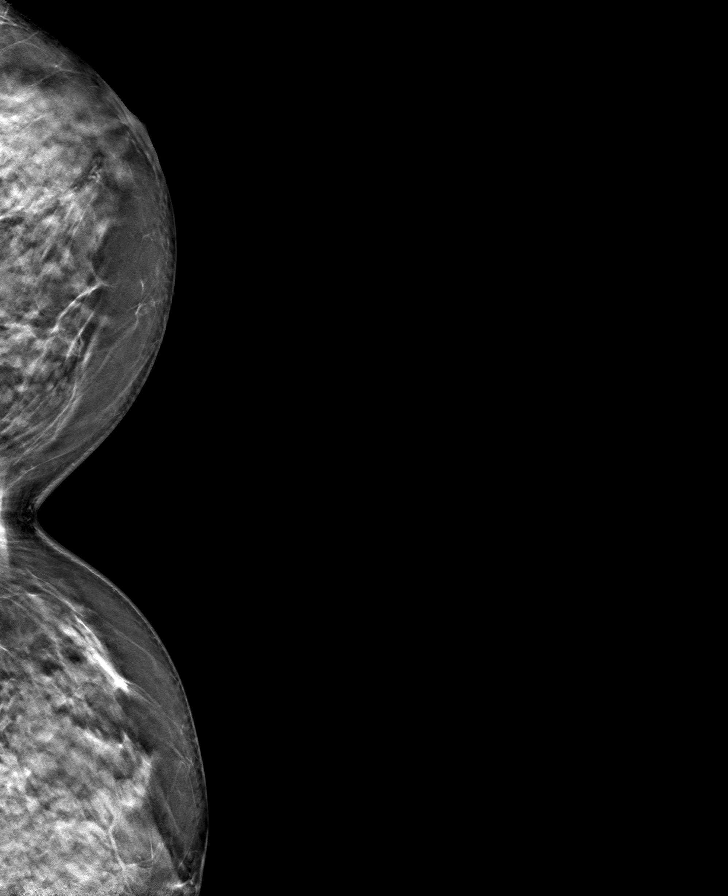

[8 of 23 positions shown; findings below may reference images not displayed]

ACR Breast Density Category c: The breast tissue is heterogeneously
dense, which may obscure small masses.
FINDINGS: There are no findings suspicious for malignancy. Images were
processed with CAD.
IMPRESSION: No mammographic evidence of malignancy. A result letter of this
screening mammogram will be mailed directly to the patient.

RECOMMENDATION:
Screening mammogram in one year. (Code:FT-U-LHB)

BI-RADS CATEGORY  1: Negative.

## 2021-09-17 ENCOUNTER — Encounter: Payer: Self-pay | Admitting: Family Medicine

## 2021-09-17 DIAGNOSIS — M21619 Bunion of unspecified foot: Secondary | ICD-10-CM

## 2021-09-24 ENCOUNTER — Ambulatory Visit (INDEPENDENT_AMBULATORY_CARE_PROVIDER_SITE_OTHER): Payer: PRIVATE HEALTH INSURANCE

## 2021-09-24 ENCOUNTER — Encounter: Payer: Self-pay | Admitting: Podiatry

## 2021-09-24 ENCOUNTER — Ambulatory Visit (INDEPENDENT_AMBULATORY_CARE_PROVIDER_SITE_OTHER): Payer: PRIVATE HEALTH INSURANCE | Admitting: Podiatry

## 2021-09-24 DIAGNOSIS — M21612 Bunion of left foot: Secondary | ICD-10-CM

## 2021-09-24 NOTE — Progress Notes (Signed)
  Subjective:  Patient ID: Vanessa Chase, female    DOB: 24-Jun-1965,   MRN: [id number]  No chief complaint on file.   56 y.o. female presents for concern of left foot bunion that has been ongoing for years. Relates the pain is only really present in certain closed shoes that causes rubbing in the area. Relates when in pain she will use OTC pain medication with some relief. She is not diabetic and no other medical conditions. Denies smoking.  Denies any other pedal complaints. Denies n/v/f/c.   Past Medical History:  Diagnosis Date   Anxiety    Depression    Gestational diabetes    Lichen simplex chronicus     Objective:  Physical Exam: Vascular: DP/PT pulses 2/4 bilateral. CFT <3 seconds. Normal hair growth on digits. No edema.  Skin. No lacerations or abrasions bilateral feet.  Musculoskeletal: MMT 5/5 bilateral lower extremities in DF, PF, Inversion and Eversion. Deceased ROM in DF of ankle joint. Left foot HAV deformity noted, tenderness to medial eminence and dorsal first MPJ. No pain with ROM. No increased first ray ROM. Able to reduce deformity with ease.  Neurological: Sensation intact to light touch.   Assessment:   1. Bunion, left foot      Plan:  Patient was evaluated and treated and all questions answered. X-rays reviewed and discussed with patient. No acute fractures or dislocations noted.  HAV deformity noted with IM 1-2 angle of about 12. No degenerative changes noted around first MPJ. There is some spurring noted to proximal medial eminence. Slight met elevatus.  -Discussed HAV and treatment options;conservative and surgical management; risks, benefits, alternatives discussed. All patient's questions answered. -Discussed padding and wide shoe gear.   -Recommend continue with good supportive shoes and inserts.  -Discussed surgical options. Patient has exhausted conservative treatment and is ready to talk about surgical correction. Discussed [place] bunionectomy and  possible [place] osteotomy. Discussed non-weightbearing for about 4 weeks. 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  -Informed surgical risk consent was reviewed and read aloud to the patient.  I reviewed the films.  I have discussed my findings with the patient in great detail.  I have discussed all risks including but not limited to infection, stiffness, scarring, limp, disability, deformity, damage to blood vessels and nerves, numbness, poor healing, need for braces, arthritis, chronic pain, amputation, death.  All benefits and realistic expectations discussed in great detail.  I have made no promises as to the outcome.  I have provided realistic expectations.  I have offered the patient a 2nd opinion, which they have declined and assured me they preferred to proceed despite the risks. Surgery tentatively scheduled for July 18th.    Lorenda Peck, DPM

## 2021-10-02 ENCOUNTER — Telehealth: Payer: Self-pay | Admitting: *Deleted

## 2021-10-02 NOTE — Telephone Encounter (Signed)
Patient is calling to ask and discuss if there is an alternate to bunion surgery. Please advise.

## 2021-10-12 ENCOUNTER — Telehealth: Payer: Self-pay | Admitting: Urology

## 2021-10-12 NOTE — Telephone Encounter (Signed)
DOS - 11/10/21  [place] BUNIONECTOMY LEFT --- [id number] Quintella Reichert OSTEOTOMY LEFT --- [id number]  [employer] EFFECTIVE DATE - 04/26/21  SPOKE WITH CHRISTINE S. WITH [employer] AND SHE STATED THAT FOR CPT CODES 20254 AND 27062 NO PRIOR AUTH IS REQUIRED.  REF # [id number]

## 2021-11-10 ENCOUNTER — Other Ambulatory Visit: Payer: Self-pay | Admitting: Podiatry

## 2021-11-10 DIAGNOSIS — M2012 Hallux valgus (acquired), left foot: Secondary | ICD-10-CM

## 2021-11-10 MED ORDER — OXYCODONE HCL 5 MG PO TABS: 5.0000 mg | ORAL_TABLET | ORAL | 0 refills | Status: DC | PRN

## 2021-11-10 MED ORDER — ONDANSETRON HCL 4 MG PO TABS: 4.0000 mg | ORAL_TABLET | Freq: Three times a day (TID) | ORAL | 0 refills | Status: DC | PRN

## 2021-11-10 MED ORDER — CEPHALEXIN 500 MG PO CAPS: 500.0000 mg | ORAL_CAPSULE | Freq: Four times a day (QID) | ORAL | 0 refills | Status: AC

## 2021-11-10 MED ORDER — ASPIRIN 81 MG PO TBEC: 81.0000 mg | DELAYED_RELEASE_TABLET | Freq: Two times a day (BID) | ORAL | 12 refills | Status: DC

## 2021-11-17 ENCOUNTER — Encounter: Payer: Self-pay | Admitting: Podiatry

## 2021-11-19 ENCOUNTER — Encounter: Payer: Self-pay | Admitting: Podiatry

## 2021-11-19 ENCOUNTER — Ambulatory Visit (INDEPENDENT_AMBULATORY_CARE_PROVIDER_SITE_OTHER): Payer: PRIVATE HEALTH INSURANCE | Admitting: Podiatry

## 2021-11-19 ENCOUNTER — Ambulatory Visit (INDEPENDENT_AMBULATORY_CARE_PROVIDER_SITE_OTHER): Payer: PRIVATE HEALTH INSURANCE

## 2021-11-19 DIAGNOSIS — Z9889 Other specified postprocedural states: Secondary | ICD-10-CM | POA: Diagnosis not present

## 2021-11-19 DIAGNOSIS — T81509A Unspecified complication of foreign body accidentally left in body following unspecified procedure, initial encounter: Secondary | ICD-10-CM

## 2021-11-19 NOTE — Progress Notes (Signed)
  Subjective:  Patient ID: Vanessa Chase, female    DOB: [birth year],  MRN: [id number]  No chief complaint on file.   DOS: 11/10/21 Procedure: Left foot bunionectomy  56 y.o. female returns for POV#1. Relates she is doing well and managing with pain.   Review of Systems: Negative except as noted in the HPI. Denies N/V/F/Ch.  Past Medical History:  Diagnosis Date   Anxiety    Depression    Gestational diabetes    Lichen simplex chronicus     Current Outpatient Medications:    aspirin EC 81 MG tablet, Take 1 tablet (81 mg total) by mouth in the morning and at bedtime. Swallow whole., Disp: 30 tablet, Rfl: 12   Azelaic Acid 15 % gel, Apply topically 2 (two) times daily. After skin is thoroughly washed and patted dry, gently but thoroughly massage a thin film of azelaic acid cream into the affected area twice daily, in the morning and evening., Disp: , Rfl:    doxycycline (DORYX) 100 MG EC tablet, Take 100 mg by mouth daily., Disp: , Rfl:    metroNIDAZOLE (METROGEL) 0.75 % gel, Apply 1 application topically 2 (two) times daily. (Patient not taking: Reported on 07/02/2021), Disp: 45 g, Rfl: 2   ondansetron (ZOFRAN) 4 MG tablet, Take 1 tablet (4 mg total) by mouth every 8 (eight) hours as needed for nausea or vomiting., Disp: 20 tablet, Rfl: 0   oxyCODONE (ROXICODONE) 5 MG immediate release tablet, Take 1 tablet (5 mg total) by mouth every 4 (four) hours as needed for severe pain., Disp: 30 tablet, Rfl: 0   sertraline (ZOLOFT) 100 MG tablet, Take 1 tablet (100 mg total) by mouth daily., Disp: 90 tablet, Rfl: 2  Social History   Tobacco Use  Smoking Status Never  Smokeless Tobacco Never    Allergies  Allergen Reactions   Betadine [Povidone Iodine]     hives   Nitrofurantoin     REACTION: Rash, SOB   Povidone-Iodine     REACTION: Allergic reaction   Objective:  There were no vitals filed for this visit. There is no height or weight on file to calculate BMI. Constitutional Well  developed. Well nourished.  Vascular Foot warm and well perfused. Capillary refill normal to all digits.   Neurologic Normal speech. Oriented to person, place, and time. Epicritic sensation to light touch grossly present bilaterally.  Dermatologic Skin healing well without signs of infection. Skin edges well coapted without signs of infection.  Orthopedic: Tenderness to palpation noted about the surgical site.   Radiographs: Hardware intact and toe well aligned Assessment:   1. Post-operative state    Plan:  Patient was evaluated and treated and all questions answered.  S/p foot surgery left -Progressing as expected post-operatively. -WB Status: NWB in CAM boot -Sutures: intact. -Medications: n/a -Foot redressed. -Return in 2 weeks for post-op visit.   No follow-ups on file.

## 2021-12-01 ENCOUNTER — Other Ambulatory Visit: Payer: Self-pay | Admitting: Family Medicine

## 2021-12-01 DIAGNOSIS — Z1231 Encounter for screening mammogram for malignant neoplasm of breast: Secondary | ICD-10-CM

## 2021-12-03 ENCOUNTER — Encounter: Payer: PRIVATE HEALTH INSURANCE | Admitting: Podiatry

## 2021-12-04 ENCOUNTER — Ambulatory Visit (INDEPENDENT_AMBULATORY_CARE_PROVIDER_SITE_OTHER): Payer: PRIVATE HEALTH INSURANCE

## 2021-12-04 ENCOUNTER — Ambulatory Visit (INDEPENDENT_AMBULATORY_CARE_PROVIDER_SITE_OTHER): Payer: PRIVATE HEALTH INSURANCE | Admitting: Podiatry

## 2021-12-04 ENCOUNTER — Encounter: Payer: Self-pay | Admitting: Podiatry

## 2021-12-04 DIAGNOSIS — M21612 Bunion of left foot: Secondary | ICD-10-CM

## 2021-12-04 DIAGNOSIS — Z9889 Other specified postprocedural states: Secondary | ICD-10-CM

## 2021-12-04 DIAGNOSIS — Z8739 Personal history of other diseases of the musculoskeletal system and connective tissue: Secondary | ICD-10-CM

## 2021-12-04 NOTE — Progress Notes (Signed)
  Subjective:  Patient ID: Vanessa Chase, female    DOB: August 08, 1965,  MRN: [id number]  No chief complaint on file.   DOS: 11/10/21 Procedure: Left foot bunionectomy  56 y.o. female returns for POV#2. Relates she is doing well and managing with pain. No issues.   Review of Systems: Negative except as noted in the HPI. Denies N/V/F/Ch.  Past Medical History:  Diagnosis Date   Anxiety    Depression    Gestational diabetes    Lichen simplex chronicus     Current Outpatient Medications:    aspirin EC 81 MG tablet, Take 1 tablet (81 mg total) by mouth in the morning and at bedtime. Swallow whole., Disp: 30 tablet, Rfl: 12   Azelaic Acid 15 % gel, Apply topically 2 (two) times daily. After skin is thoroughly washed and patted dry, gently but thoroughly massage a thin film of azelaic acid cream into the affected area twice daily, in the morning and evening., Disp: , Rfl:    doxycycline (DORYX) 100 MG EC tablet, Take 100 mg by mouth daily., Disp: , Rfl:    metroNIDAZOLE (METROGEL) 0.75 % gel, Apply 1 application topically 2 (two) times daily. (Patient not taking: Reported on 07/02/2021), Disp: 45 g, Rfl: 2   ondansetron (ZOFRAN) 4 MG tablet, Take 1 tablet (4 mg total) by mouth every 8 (eight) hours as needed for nausea or vomiting., Disp: 20 tablet, Rfl: 0   oxyCODONE (ROXICODONE) 5 MG immediate release tablet, Take 1 tablet (5 mg total) by mouth every 4 (four) hours as needed for severe pain., Disp: 30 tablet, Rfl: 0   sertraline (ZOLOFT) 100 MG tablet, Take 1 tablet (100 mg total) by mouth daily., Disp: 90 tablet, Rfl: 2  Social History   Tobacco Use  Smoking Status Never  Smokeless Tobacco Never    Allergies  Allergen Reactions   Betadine [Povidone Iodine]     hives   Nitrofurantoin     REACTION: Rash, SOB   Povidone-Iodine     REACTION: Allergic reaction   Objective:  There were no vitals filed for this visit. There is no height or weight on file to calculate  BMI. Constitutional Well developed. Well nourished.  Vascular Foot warm and well perfused. Capillary refill normal to all digits.   Neurologic Normal speech. Oriented to person, place, and time. Epicritic sensation to light touch grossly present bilaterally.  Dermatologic Skin healing well without signs of infection. Skin edges well coapted without signs of infection.  Orthopedic: Tenderness to palpation noted about the surgical site.   Radiographs: Hardware intact and toe well aligned Assessment:   1. Post-operative state   2. Bunion, left foot    Plan:  Patient was evaluated and treated and all questions answered.  S/p foot surgery left -Progressing as expected post-operatively. -WB Status: NWB in CAM boot for one more week then may transition to WBAT in CAM boot for two weeks.  -Medications: n/a -Foot redressed. -Return in 3 weeks for post-op visit.   No follow-ups on file.

## 2021-12-09 ENCOUNTER — Telehealth: Payer: Self-pay | Admitting: *Deleted

## 2021-12-09 NOTE — Telephone Encounter (Signed)
Patient is calling because her surgical tape has loosened up, she removed and now it seems that the skin is pulling apart, should she be seen sooner than Aug. 31 st? Please advise.

## 2021-12-10 ENCOUNTER — Telehealth: Payer: Self-pay

## 2021-12-10 NOTE — Telephone Encounter (Signed)
Encounter created in error

## 2021-12-10 NOTE — Telephone Encounter (Signed)
Yes lets have her come in sooner so I can take a look. Thanks

## 2021-12-24 ENCOUNTER — Encounter: Payer: Self-pay | Admitting: Podiatry

## 2021-12-24 ENCOUNTER — Ambulatory Visit (INDEPENDENT_AMBULATORY_CARE_PROVIDER_SITE_OTHER): Payer: PRIVATE HEALTH INSURANCE | Admitting: Podiatry

## 2021-12-24 ENCOUNTER — Ambulatory Visit (INDEPENDENT_AMBULATORY_CARE_PROVIDER_SITE_OTHER): Payer: PRIVATE HEALTH INSURANCE

## 2021-12-24 DIAGNOSIS — Z9889 Other specified postprocedural states: Secondary | ICD-10-CM

## 2021-12-24 DIAGNOSIS — M21612 Bunion of left foot: Secondary | ICD-10-CM

## 2021-12-24 NOTE — Progress Notes (Signed)
  Subjective:  Patient ID: Vanessa Chase, female    DOB: August 18, 1965,  MRN: [id number]  No chief complaint on file.   DOS: 11/10/21 Procedure: Left foot bunionectomy  56 y.o. female returns for POV#2. Relates she is doing well and managing with pain. No issues.   Review of Systems: Negative except as noted in the HPI. Denies N/V/F/Ch.  Past Medical History:  Diagnosis Date   Anxiety    Depression    Gestational diabetes    Lichen simplex chronicus     Current Outpatient Medications:    aspirin EC 81 MG tablet, Take 1 tablet (81 mg total) by mouth in the morning and at bedtime. Swallow whole., Disp: 30 tablet, Rfl: 12   Azelaic Acid 15 % gel, Apply topically 2 (two) times daily. After skin is thoroughly washed and patted dry, gently but thoroughly massage a thin film of azelaic acid cream into the affected area twice daily, in the morning and evening., Disp: , Rfl:    doxycycline (DORYX) 100 MG EC tablet, Take 100 mg by mouth daily., Disp: , Rfl:    metroNIDAZOLE (METROGEL) 0.75 % gel, Apply 1 application topically 2 (two) times daily. (Patient not taking: Reported on 07/02/2021), Disp: 45 g, Rfl: 2   ondansetron (ZOFRAN) 4 MG tablet, Take 1 tablet (4 mg total) by mouth every 8 (eight) hours as needed for nausea or vomiting., Disp: 20 tablet, Rfl: 0   oxyCODONE (ROXICODONE) 5 MG immediate release tablet, Take 1 tablet (5 mg total) by mouth every 4 (four) hours as needed for severe pain., Disp: 30 tablet, Rfl: 0   sertraline (ZOLOFT) 100 MG tablet, Take 1 tablet (100 mg total) by mouth daily., Disp: 90 tablet, Rfl: 2  Social History   Tobacco Use  Smoking Status Never  Smokeless Tobacco Never    Allergies  Allergen Reactions   Betadine [Povidone Iodine]     hives   Nitrofurantoin     REACTION: Rash, SOB   Povidone-Iodine     REACTION: Allergic reaction   Objective:  There were no vitals filed for this visit. There is no height or weight on file to calculate  BMI. Constitutional Well developed. Well nourished.  Vascular Foot warm and well perfused. Capillary refill normal to all digits.   Neurologic Normal speech. Oriented to person, place, and time. Epicritic sensation to light touch grossly present bilaterally.  Dermatologic Skin healed well without signs of infection. Skin edges well coapted without signs of infection.  Orthopedic: Tenderness to palpation noted about the surgical site.   Radiographs: Hardware intact and toe well aligned Assessment:   No diagnosis found.  Plan:  Patient was evaluated and treated and all questions answered.  S/p foot surgery left -Progressing as expected post-operatively. -WB Status: May transition to WBAT in regular shoes.  -Medications: n/a -Return in 6 weeks for final post-operative check.   No follow-ups on file.

## 2022-01-07 ENCOUNTER — Encounter: Payer: PRIVATE HEALTH INSURANCE | Admitting: Family Medicine

## 2022-01-08 ENCOUNTER — Encounter: Payer: Self-pay | Admitting: Podiatry

## 2022-01-11 ENCOUNTER — Ambulatory Visit (INDEPENDENT_AMBULATORY_CARE_PROVIDER_SITE_OTHER): Payer: PRIVATE HEALTH INSURANCE | Admitting: Family Medicine

## 2022-01-11 ENCOUNTER — Encounter: Payer: Self-pay | Admitting: Family Medicine

## 2022-01-11 VITALS — BP 120/69 | HR 74 | Wt 140.0 lb

## 2022-01-11 DIAGNOSIS — Z0000 Encounter for general adult medical examination without abnormal findings: Secondary | ICD-10-CM

## 2022-01-11 DIAGNOSIS — Z23 Encounter for immunization: Secondary | ICD-10-CM | POA: Diagnosis not present

## 2022-01-11 NOTE — Progress Notes (Signed)
Complete physical exam  Patient: Vanessa Chase   DOB: 09/10/1965   56 y.o. Female  MRN: [id number]  Subjective:    Chief Complaint  Patient presents with   Annual Exam    Vanessa Chase is a 56 y.o. female who presents today for a complete physical exam. She reports consuming a general diet.  Walking 5 days/week.  He does swimming in the summertime.   She generally feels well. She does have additional problems to discuss today.  Is a Emergency planning/management officer.  Brought in forms for work to be completed for biometric screening.  She did have some questions about the new COVID-vaccine coming out.   Most recent fall risk assessment:    01/11/2022    2:39 PM  Fall Risk   Falls in the past year? 0  Number falls in past yr: 0  Injury with Fall? 0  Risk for fall due to : No Fall Risks  Follow up Falls evaluation completed     Most recent depression screenings:    01/11/2022    2:39 PM 01/05/2021    2:40 PM  PHQ 2/9 Scores  PHQ - 2 Score 0 0        Patient Care Team: Agapito Games, MD as PCP - General Darcel Bayley Chong Sicilian, CNM as Referring Physician (Certified Nurse Midwife) Louann Sjogren, DPM as Consulting Physician (Podiatry)   Outpatient Medications Prior to Visit  Medication Sig   Azelaic Acid 15 % gel Apply topically 2 (two) times daily. After skin is thoroughly washed and patted dry, gently but thoroughly massage a thin film of azelaic acid cream into the affected area twice daily, in the morning and evening.   doxycycline (DORYX) 100 MG EC tablet Take 100 mg by mouth daily.   sertraline (ZOLOFT) 100 MG tablet Take 1 tablet (100 mg total) by mouth daily.   [DISCONTINUED] aspirin EC 81 MG tablet Take 1 tablet (81 mg total) by mouth in the morning and at bedtime. Swallow whole. (Patient not taking: Reported on 01/11/2022)   [DISCONTINUED] metroNIDAZOLE (METROGEL) 0.75 % gel Apply 1 application topically 2 (two) times daily. (Patient not taking: Reported on 07/02/2021)    [DISCONTINUED] ondansetron (ZOFRAN) 4 MG tablet Take 1 tablet (4 mg total) by mouth every 8 (eight) hours as needed for nausea or vomiting. (Patient not taking: Reported on 01/11/2022)   [DISCONTINUED] oxyCODONE (ROXICODONE) 5 MG immediate release tablet Take 1 tablet (5 mg total) by mouth every 4 (four) hours as needed for severe pain. (Patient not taking: Reported on 01/11/2022)   No facility-administered medications prior to visit.    ROS        Objective:     BP 120/69   Pulse 74   Wt 140 lb (63.5 kg)   LMP 11/11/2015   SpO2 97%   BMI 23.30 kg/m     Physical Exam Vitals and nursing note reviewed.  Constitutional:      Appearance: Normal appearance. She is well-developed.  HENT:     Head: Normocephalic and atraumatic.     Right Ear: Tympanic membrane, ear canal and external ear normal.     Left Ear: Tympanic membrane, ear canal and external ear normal.     Nose: Nose normal.     Mouth/Throat:     Mouth: Mucous membranes are moist.     Pharynx: Oropharynx is clear. No posterior oropharyngeal erythema.  Eyes:     Conjunctiva/sclera: Conjunctivae normal.     Pupils: Pupils  are equal, round, and reactive to light.  Neck:     Thyroid: No thyromegaly.  Cardiovascular:     Rate and Rhythm: Normal rate and regular rhythm.     Heart sounds: Normal heart sounds.  Pulmonary:     Effort: Pulmonary effort is normal.     Breath sounds: Normal breath sounds. No wheezing.  Abdominal:     General: Bowel sounds are normal.     Palpations: Abdomen is soft.  Musculoskeletal:     Cervical back: Neck supple.  Lymphadenopathy:     Cervical: No cervical adenopathy.  Skin:    General: Skin is warm and dry.  Neurological:     General: No focal deficit present.     Mental Status: She is alert and oriented to person, place, and time.  Psychiatric:        Mood and Affect: Mood normal.        Behavior: Behavior normal.      No results found for any visits on 01/11/22.       Assessment & Plan:    Routine Health Maintenance and Physical Exam  Immunization History  Administered Date(s) Administered   Influenza,inj,Quad PF,6+ Mos 01/11/2022   Moderna Sars-Covid-2 Vaccination 07/19/2019, 08/16/2019, 03/17/2020, 10/15/2020   Tdap 02/20/2010, 06/11/2019   Zoster Recombinat (Shingrix) 11/07/2017, 03/17/2018    Health Maintenance  Topic Date Due   COVID-19 Vaccine (5 - Moderna series) 12/10/2020   PAP SMEAR-Modifier  06/10/2022   MAMMOGRAM  01/09/2023   COLONOSCOPY (Pts 45-43yrs Insurance coverage will need to be confirmed)  07/03/2026   TETANUS/TDAP  06/10/2029   INFLUENZA VACCINE  Completed   Hepatitis C Screening  Completed   Zoster Vaccines- Shingrix  Completed   Pneumococcal Vaccine 27-78 Years old  Aged Out   HPV VACCINES  Aged Out   HIV Screening  Discontinued    Discussed health benefits of physical activity, and encouraged her to engage in regular exercise appropriate for her age and condition.  Problem List Items Addressed This Visit   None Visit Diagnoses     Wellness examination    -  Primary   Relevant Orders   Lipid Panel w/reflex Direct LDL   COMPLETE METABOLIC PANEL WITH GFR   CBC       Keep up a regular exercise program and make sure you are eating a healthy diet Try to eat 4 servings of dairy a day, or if you are lactose intolerant take a calcium with vitamin D daily.  Your vaccines are up to date.   Return in about 1 year (around 01/12/2023) for Wellness Exam.     Beatrice Lecher, MD

## 2022-01-13 LAB — COMPLETE METABOLIC PANEL WITH GFR
AG Ratio: 2.2 (calc) (ref 1.0–2.5)
ALT: 11 U/L (ref 6–29)
AST: 16 U/L (ref 10–35)
Albumin: 4.7 g/dL (ref 3.6–5.1)
Alkaline phosphatase (APISO): 94 U/L (ref 37–153)
BUN: 19 mg/dL (ref 7–25)
CO2: 28 mmol/L (ref 20–32)
Calcium: 9.7 mg/dL (ref 8.6–10.4)
Chloride: 103 mmol/L (ref 98–110)
Creat: 0.61 mg/dL (ref 0.50–1.03)
Globulin: 2.1 g/dL (calc) (ref 1.9–3.7)
Glucose, Bld: 96 mg/dL (ref 65–99)
Potassium: 5.1 mmol/L (ref 3.5–5.3)
Sodium: 139 mmol/L (ref 135–146)
Total Bilirubin: 1.2 mg/dL (ref 0.2–1.2)
Total Protein: 6.8 g/dL (ref 6.1–8.1)
eGFR: 104 mL/min/{1.73_m2} (ref >=60)

## 2022-01-13 LAB — CBC
HCT: 39.1 % (ref 35.0–45.0)
Hemoglobin: 13.3 g/dL (ref 11.7–15.5)
MCH: 32.1 pg (ref 27.0–33.0)
MCHC: 34 g/dL (ref 32.0–36.0)
MCV: 94.4 fL (ref 80.0–100.0)
MPV: 12.5 fL (ref 7.5–12.5)
Platelets: 148 Thousand/uL (ref 140–400)
RBC: 4.14 Million/uL (ref 3.80–5.10)
RDW: 12.1 % (ref 11.0–15.0)
WBC: 3 Thousand/uL — ABNORMAL LOW (ref 3.8–10.8)

## 2022-01-13 LAB — LIPID PANEL W/REFLEX DIRECT LDL
Cholesterol: 223 mg/dL — ABNORMAL HIGH (ref <200)
HDL: 85 mg/dL (ref >=50)
LDL Cholesterol (Calc): 123 mg/dL (calc) — ABNORMAL HIGH
Non-HDL Cholesterol (Calc): 138 mg/dL (calc) — ABNORMAL HIGH (ref <130)
Total CHOL/HDL Ratio: 2.6 (calc) (ref <5.0)
Triglycerides: 49 mg/dL (ref <150)

## 2022-01-13 NOTE — Progress Notes (Signed)
Vanessa Chase, your LDL cholesterol is up a little bit compared to last year.  But better than 2 years ago.  White blood cell count is borderline low but stable.  General labs are normal.

## 2022-01-14 ENCOUNTER — Ambulatory Visit (INDEPENDENT_AMBULATORY_CARE_PROVIDER_SITE_OTHER): Payer: PRIVATE HEALTH INSURANCE

## 2022-01-14 DIAGNOSIS — Z1231 Encounter for screening mammogram for malignant neoplasm of breast: Secondary | ICD-10-CM

## 2022-01-15 NOTE — Progress Notes (Signed)
Please call patient. Normal mammogram.  Repeat in 1 year.  

## 2022-02-05 ENCOUNTER — Ambulatory Visit (INDEPENDENT_AMBULATORY_CARE_PROVIDER_SITE_OTHER): Payer: PRIVATE HEALTH INSURANCE | Admitting: Podiatrist

## 2022-02-05 ENCOUNTER — Ambulatory Visit (INDEPENDENT_AMBULATORY_CARE_PROVIDER_SITE_OTHER): Payer: PRIVATE HEALTH INSURANCE

## 2022-02-05 ENCOUNTER — Encounter: Payer: Self-pay | Admitting: Podiatrist

## 2022-02-05 DIAGNOSIS — Z9889 Other specified postprocedural states: Secondary | ICD-10-CM

## 2022-02-05 DIAGNOSIS — M21612 Bunion of left foot: Secondary | ICD-10-CM

## 2022-02-05 NOTE — Progress Notes (Signed)
Chief Complaint  Patient presents with   Routine Post Op    Patient states foot is getting better she in concern about the scar tissue     HPI: Patient is 56 y.o. female who presents today for post op follow up for bunon surgery on her left foot.     Allergies  Allergen Reactions   Betadine [Povidone Iodine]     hives   Nitrofurantoin     REACTION: Rash, SOB   Povidone-Iodine     REACTION: Allergic reaction    Review of systems is negative except as noted in the HPI.  Denies nausea/ vomiting/ fevers/ chills or night sweats.   Denies difficulty breathing, denies calf pain or tenderness  Physical Exam  Patient is awake, alert, and oriented x 3.  In no acute distress.    Vascular status is intact with palpable pedal pulses DP and PT bilateral and capillary refill time less than 3 seconds bilateral.  No edema or erythema noted.   Neurological exam reveals epicritic and protective sensation grossly intact bilateral.   Dermatological exam reveals skin is supple and dry to bilateral feet.  Scar is improving. Incision site fully healed. No rednes, swelling or soi noted.  Small cyst like lesion present dorsal medial foot  which feels like a ganglion type cyst.    Musculoskeletal exam: excellent appearance post operatively is noted.  Hallux slightly drifts laterally when flexing her toes.  Some swelling at the first metatarsal head still present.    Xrays- 3 views of the left foot obtained.  Excellent consolidation of the osteotomy of the first metatarsal with screw fixation noted to be in good position.  Good alignment and position of the first mpj noted. Well healing foot noted.    Assessment:   ICD-10-CM   1. Status post left foot surgery  Z98.890 DG Foot Complete Left    2. Bunion, left foot  M21.612        Plan: Discussed exam and xray findings.  Discussed she may wean out of her compression sleeve when ready.  She may also try whatever shoes are comfortable, avoiding heels or  high wedge shoes at this time.  She may also start to use some mederma or scar away on the incision site.  Range of motion exercises discussed.  Spacer for between toes 1 and 2 dispensed as well.  She will be seen for her final post op visit in 2-3 months with Dr. Blenda Mounts. If any questions arise she will call.

## 2022-02-12 ENCOUNTER — Telehealth (INDEPENDENT_AMBULATORY_CARE_PROVIDER_SITE_OTHER): Payer: PRIVATE HEALTH INSURANCE | Admitting: Family Medicine

## 2022-02-12 ENCOUNTER — Encounter: Payer: Self-pay | Admitting: Family Medicine

## 2022-02-12 VITALS — BP 113/66 | HR 70 | Temp 98.4°F

## 2022-02-12 DIAGNOSIS — J029 Acute pharyngitis, unspecified: Secondary | ICD-10-CM

## 2022-02-12 LAB — POC COVID19 BINAXNOW: SARS Coronavirus 2 Ag: NEGATIVE

## 2022-02-12 LAB — POCT RAPID STREP A (OFFICE): Rapid Strep A Screen: NEGATIVE

## 2022-02-12 MED ORDER — PREDNISONE 20 MG PO TABS: 40.0000 mg | ORAL_TABLET | Freq: Every day | ORAL | 0 refills | Status: DC

## 2022-02-12 NOTE — Progress Notes (Signed)
    Virtual Visit via Video Note  I connected with Vanessa Chase on 02/12/22 at  1:00 PM EDT by a video enabled telemedicine application and verified that I am speaking with the correct person using two identifiers.   I discussed the limitations of evaluation and management by telemedicine and the availability of in person appointments. The patient expressed understanding and agreed to proceed.  Patient location: in car Provider location: in office  Had patient come into office for strep and covid testing.     Subjective:    CC:   Chief Complaint  Patient presents with   Sore Throat    HPI: Pt reports that her throat started bothering her 1 day ago. She said that she has been taking pain relievers, supplements, and oils. She said that it hurts to swallow. Some sinus drainage.  No cough, congestion, GI sxs.     She did take a picture of her throat and it is red and she did notice some white patches.   She denies any f/s/c/n/v/d.    Past medical history, Surgical history, Family history not pertinant except as noted below, Social history, Allergies, and medications have been entered into the medical record, reviewed, and corrections made.    Objective:    General: Speaking clearly in complete sentences without any shortness of breath.  Alert and oriented x3.  Normal judgment. No apparent acute distress.  Note: OP is very erythematous with cobblestoning.      Impression and Recommendations:    Problem List Items Addressed This Visit   None Visit Diagnoses     Pharyngitis, unspecified etiology    -  Primary   Relevant Medications   predniSONE (DELTASONE) 20 MG tablet   Other Relevant Orders   POC COVID-19 (Completed)   POCT rapid strep A (Completed)      Pharyngitis-strep negative and COVID-negative.  Most consistent with viral illness.  Will treat with prednisone for pain relief since her pain is quite severe to the point that she has been spitting into a cup  because it so painful to swallow.  If she is not getting better after the weekend then please let us know.  Orders Placed This Encounter  Procedures   POC COVID-19    Order Specific Question:   Previously tested for COVID-19    Answer:   No    Order Specific Question:   Resident in a congregate (group) care setting    Answer:   No    Order Specific Question:   Employed in healthcare setting    Answer:   No    Order Specific Question:   Pregnant    Answer:   No   POCT rapid strep A    Meds ordered this encounter  Medications   predniSONE (DELTASONE) 20 MG tablet    Sig: Take 2 tablets (40 mg total) by mouth daily with breakfast.    Dispense:  10 tablet    Refill:  0     I discussed the assessment and treatment plan with the patient. The patient was provided an opportunity to ask questions and all were answered. The patient agreed with the plan and demonstrated an understanding of the instructions.   The patient was advised to call back or seek an in-person evaluation if the symptoms worsen or if the condition fails to improve as anticipated.   Beatrice Lecher, MD

## 2022-02-12 NOTE — Progress Notes (Signed)
Vanessa Chase, you are negative for COVID and strep throat.  That means this is a viral pharyngitis.  Since it is quite severe I did send in some prednisone for you to take to help with the pain and inflammation.  Also recommend salt water gargles and just trying to eat cool soft foods such as popsicles that may help relieve pain and swelling.  I also recommend ibuprofen or Aleve that can also help with pain and inflammation.  Call if you are not feeling at least some better by Monday.

## 2022-02-12 NOTE — Progress Notes (Signed)
Pt reports that her throat started bothering her 1 day ago. She said that she has been taking pain relievers, supplements, and oils. She said that it hurts to swallow.   She did take a picture of her throat and it is red and she did notice some white patches.   She denies any f/s/c/n/v/d.

## 2022-02-26 ENCOUNTER — Encounter: Payer: Self-pay | Admitting: Family Medicine

## 2022-02-26 MED ORDER — AZITHROMYCIN 250 MG PO TABS: ORAL_TABLET | ORAL | 0 refills | Status: AC

## 2022-02-26 NOTE — Telephone Encounter (Signed)
Sent in zpack  Call if not ebetter in one week  Meds ordered this encounter  Medications   azithromycin (ZITHROMAX) 250 MG tablet    Sig: Take 2 tablets on day 1, then 1 tablet daily on days 2 through 5    Dispense:  6 tablet    Refill:  0

## 2022-03-05 ENCOUNTER — Encounter: Payer: Self-pay | Admitting: Family Medicine

## 2022-03-06 ENCOUNTER — Encounter: Payer: Self-pay | Admitting: Family Medicine

## 2022-03-08 ENCOUNTER — Telehealth: Payer: Self-pay | Admitting: General Practice

## 2022-03-08 NOTE — Telephone Encounter (Signed)
Transition Care Management Follow-up Telephone Call Date of discharge and from where: 03/05/22 from [facility] How have you been since you were released from the hospital? Sill in pain; using muscle relaxer, pain patch and prednisone. Would like to be able to see ortho as soon as possible. Scheduled with Dr. Karie Schwalbe for tomorrow.  Any questions or concerns? No  Items Reviewed: Did the pt receive and understand the discharge instructions provided? Yes  Medications obtained and verified? Yes  Other? No  Any new allergies since your discharge? No  Dietary orders reviewed? Yes Do you have support at home? Yes   Home Care and Equipment/Supplies: Were home health services ordered? no  Functional Questionnaire: (I = Independent and D = Dependent) ADLs: I  Bathing/Dressing- I  Meal Prep- I  Eating- I  Maintaining continence- I  Transferring/Ambulation- I  Managing Meds- I  Follow up appointments reviewed:  PCP Hospital f/u appt confirmed? No   Specialist Hospital f/u appt confirmed? Yes  Scheduled to see Dr. Karie Schwalbe on 03/09/22 @ 130. Are transportation arrangements needed? No  If their condition worsens, is the pt aware to call PCP or go to the Emergency Dept.? Yes Was the patient provided with contact information for the PCP's office or ED? Yes Was to pt encouraged to call back with questions or concerns? Yes

## 2022-03-08 NOTE — Telephone Encounter (Signed)
We can schedule with Dr. Karie Schwalbe unless he doesn't have anything

## 2022-03-09 ENCOUNTER — Ambulatory Visit (INDEPENDENT_AMBULATORY_CARE_PROVIDER_SITE_OTHER): Payer: No Typology Code available for payment source | Admitting: Sports Medicine

## 2022-03-09 DIAGNOSIS — M5136 Other intervertebral disc degeneration, lumbar region with discogenic back pain only: Secondary | ICD-10-CM | POA: Insufficient documentation

## 2022-03-09 MED ORDER — PREDNISONE 10 MG (48) PO TBPK: ORAL_TABLET | Freq: Every day | ORAL | 0 refills | Status: DC

## 2022-03-09 MED ORDER — MELOXICAM 15 MG PO TABS: ORAL_TABLET | ORAL | 3 refills | Status: DC

## 2022-03-09 MED ORDER — METHOCARBAMOL 1000 MG PO TABS: 1000.0000 mg | ORAL_TABLET | Freq: Two times a day (BID) | ORAL | 3 refills | Status: DC | PRN

## 2022-03-09 NOTE — Telephone Encounter (Signed)
Patient is schld today 03/09/22 with Dr. Karie Schwalbe.

## 2022-03-09 NOTE — Progress Notes (Signed)
    Procedures performed today:    None.  Independent interpretation of notes and tests performed by another provider:   None.  Brief History, Exam, Impression, and Recommendations:    Discogenic low back pain This is a very pleasant 56 year old female, she is a Manufacturing systems engineer, she spends a lot of time on her knees and bending over but the children. Unfortunately she developed increasing axial low back pain localized predominantly the bilateral quadratus lumborum worse with sitting, flexion, Valsalva, no red flag symptoms and nothing radicular. Pain was bad enough to send her to the emergency department, she had lumbar spine x-rays, lumbar spine CT, all of which were negative, urinalysis was negative. Given narcotics in the ED, short course of steroids and methocarbamol. She still has significant pain so we will add a 12-day taper, followed by meloxicam, I will bump up her methocarbamol to high-dose. We discussed the anatomy and evolutionary anthropology of disc disease, and she understands the importance of aggressive physical therapy, she will do home therapy to start. Return to see me in 6 weeks, MRI for interventional planning if not better.    ____________________________________________ Ihor Austin. Benjamin Stain, M.D., ABFM., CAQSM., AME. Primary Care and Sports Medicine [facility]  Adjunct Professor of Family Medicine  [employer]  Restaurant manager, fast food

## 2022-03-09 NOTE — Assessment & Plan Note (Signed)
This is a very pleasant 56 year old female, she is a Manufacturing systems engineer, she spends a lot of time on her knees and bending over but the children. Unfortunately she developed increasing axial low back pain localized predominantly the bilateral quadratus lumborum worse with sitting, flexion, Valsalva, no red flag symptoms and nothing radicular. Pain was bad enough to send her to the emergency department, she had lumbar spine x-rays, lumbar spine CT, all of which were negative, urinalysis was negative. Given narcotics in the ED, short course of steroids and methocarbamol. She still has significant pain so we will add a 12-day taper, followed by meloxicam, I will bump up her methocarbamol to high-dose. We discussed the anatomy and evolutionary anthropology of disc disease, and she understands the importance of aggressive physical therapy, she will do home therapy to start. Return to see me in 6 weeks, MRI for interventional planning if not better.

## 2022-03-09 NOTE — Telephone Encounter (Signed)
Patient schld today 03/09/22 with Dr. Karie Schwalbe.

## 2022-04-13 ENCOUNTER — Ambulatory Visit (INDEPENDENT_AMBULATORY_CARE_PROVIDER_SITE_OTHER): Payer: No Typology Code available for payment source | Admitting: Sports Medicine

## 2022-04-13 DIAGNOSIS — M5136 Other intervertebral disc degeneration, lumbar region with discogenic back pain only: Secondary | ICD-10-CM

## 2022-04-13 NOTE — Assessment & Plan Note (Signed)
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 Quality of pain was discogenic. She was seen in the ED where x-rays of the lumbar spine CT were completely negative, not even a little bit of degenerative change. We added a 12-day prednisone taper followed by meloxicam, bumped up her methocarbamol to high-dose, we also had her do aggressive home physical therapy and traction as she did have an inversion table at home. She returns today very happy with results, pain-free, I wished her Vanessa Chase Christmas and she can return to see [place] as needed.

## 2022-04-13 NOTE — Progress Notes (Signed)
    Procedures performed today:    None.  Independent interpretation of notes and tests performed by another provider:   None.  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 Quality of pain was discogenic. She was seen in the ED where x-rays of the lumbar spine CT were completely negative, not even a little bit of degenerative change. We added a 12-day prednisone taper followed by meloxicam, bumped up her methocarbamol to high-dose, we also had her do aggressive home physical therapy and traction as she did have an inversion table at home. She returns today very happy with results, pain-free, I wished her Altamese Cabal Christmas and she can return to see [place] as needed.    ____________________________________________ Ihor Austin. Benjamin Stain, M.D., ABFM., CAQSM., AME. Primary Care and Sports Medicine [facility]  Adjunct Professor of Family Medicine  [place] [employer]  Restaurant manager, fast food

## 2022-05-02 ENCOUNTER — Other Ambulatory Visit: Payer: Self-pay | Admitting: Family Medicine

## 2022-05-02 DIAGNOSIS — F4321 Adjustment disorder with depressed mood: Secondary | ICD-10-CM

## 2022-05-07 ENCOUNTER — Ambulatory Visit (INDEPENDENT_AMBULATORY_CARE_PROVIDER_SITE_OTHER): Payer: PRIVATE HEALTH INSURANCE | Admitting: Podiatry

## 2022-05-07 DIAGNOSIS — Z9889 Other specified postprocedural states: Secondary | ICD-10-CM

## 2022-05-11 NOTE — Progress Notes (Signed)
Error

## 2022-05-13 ENCOUNTER — Ambulatory Visit (INDEPENDENT_AMBULATORY_CARE_PROVIDER_SITE_OTHER): Payer: PRIVATE HEALTH INSURANCE | Admitting: Podiatry

## 2022-05-13 ENCOUNTER — Ambulatory Visit (INDEPENDENT_AMBULATORY_CARE_PROVIDER_SITE_OTHER): Payer: No Typology Code available for payment source

## 2022-05-13 ENCOUNTER — Encounter: Payer: Self-pay | Admitting: Podiatry

## 2022-05-13 DIAGNOSIS — Z9889 Other specified postprocedural states: Secondary | ICD-10-CM | POA: Diagnosis not present

## 2022-05-13 NOTE — Progress Notes (Signed)
  Subjective:  Patient ID: Vanessa Chase, female    DOB: 12/01/1965,  MRN: [id number]  Chief Complaint  Patient presents with   Follow-up    Post Status left foot surgery     DOS: 11/10/21 Procedure: Left foot bunionectomy  57 y.o. female returns for POV#4 Now 6 months out from surgery. Relates she is doing well and managing with pain. No issues. Has been stretching and using scar cream.   Review of Systems: Negative except as noted in the HPI. Denies N/V/F/Ch.  Past Medical History:  Diagnosis Date   Anxiety    Depression    Gestational diabetes    Lichen simplex chronicus     Current Outpatient Medications:    Azelaic Acid 15 % gel, Apply topically 2 (two) times daily. After skin is thoroughly washed and patted dry, gently but thoroughly massage a thin film of azelaic acid cream into the affected area twice daily, in the morning and evening., Disp: , Rfl:    doxycycline (DORYX) 100 MG EC tablet, Take 100 mg by mouth daily., Disp: , Rfl:    meloxicam (MOBIC) 15 MG tablet, One tab PO every 24 hours with a meal for 2 weeks, then once every 24 hours prn pain., Disp: 30 tablet, Rfl: 3   methocarbamol 1000 MG TABS, Take 1,000 mg by mouth 2 (two) times daily as needed for muscle spasms., Disp: 60 tablet, Rfl: 3   predniSONE (STERAPRED UNI-PAK 48 TAB) 10 MG (48) TBPK tablet, Take by mouth daily. 12-day taper pack, use as directed for taper, Disp: 1 tablet, Rfl: 0   sertraline (ZOLOFT) 100 MG tablet, Take 1 tablet (100 mg total) by mouth daily., Disp: 90 tablet, Rfl: 2  Social History   Tobacco Use  Smoking Status Never  Smokeless Tobacco Never    Allergies  Allergen Reactions   Betadine [Povidone Iodine]     hives   Nitrofurantoin     REACTION: Rash, SOB   Povidone-Iodine     REACTION: Allergic reaction   Objective:  There were no vitals filed for this visit. There is no height or weight on file to calculate BMI. Constitutional Well developed. Well nourished.  Vascular  Foot warm and well perfused. Capillary refill normal to all digits.   Neurologic Normal speech. Oriented to person, place, and time. Epicritic sensation to light touch grossly present bilaterally.  Dermatologic Skin healed well without signs of infection. Skin edges well coapted without signs of infection.  Orthopedic: Tenderness to palpation noted about the surgical site.   Radiographs: Hardware intact and toe well aligned Assessment:   1. Status post left foot surgery     Plan:  Patient was evaluated and treated and all questions answered.  S/p foot surgery left -Progressing as expected post-operatively. -WB Status: Continue in regular shoes.  -Medications:Compouned scar cream ordered.  -Patient is now discharged from surgical standpoint. Follow-up as needed with any other concerns.   No follow-ups on file.

## 2022-05-25 ENCOUNTER — Encounter: Payer: Self-pay | Admitting: Podiatry

## 2022-06-02 ENCOUNTER — Encounter: Payer: Self-pay | Admitting: Podiatry

## 2022-06-03 MED ORDER — NON FORMULARY: 60.0000 g | Freq: Three times a day (TID) | Status: AC | PRN

## 2022-06-03 NOTE — Addendum Note (Signed)
Addended by: Ammie Ferrier on: 06/03/2022 02:47 PM   Modules accepted: Orders

## 2022-07-06 NOTE — Progress Notes (Unsigned)
   ANNUAL EXAM Patient name: Vanessa Chase MRN [id number]  Date of birth: 1965-11-21 Chief Complaint:   No chief complaint on file.  History of Present Illness:   Vanessa Chase is a 57 y.o. [phone] female being seen today for a routine annual exam.   Current complaints: ***  Patient's last menstrual period was 11/11/2015.   Last MXR: 12/2021 - wnl Last Pap/Pap History: 05/2020. Results were: NILM w/ HRHPV negative. H/O abnormal pap: no   Health Maintenance Due  Topic Date Due   COVID-19 Vaccine (6 - 2023-24 season) 03/19/2022   PAP SMEAR-Modifier  06/10/2022    Review of Systems:   Pertinent items are noted in HPI Denies any headaches, blurred vision, fatigue, shortness of breath, chest pain, abdominal pain, abnormal vaginal discharge/itching/odor/irritation, problems with periods, bowel movements, urination, or intercourse unless otherwise stated above. *** Pertinent History Reviewed:  Reviewed past medical,surgical, social and family history.  Reviewed problem list, medications and allergies. Physical Assessment:  There were no vitals filed for this visit.There is no height or weight on file to calculate BMI.   Physical Examination:  General appearance - well appearing, and in no distress Mental status - alert, oriented to person, place, and time Psych:  She has a normal mood and affect Skin - warm and dry, normal color, no suspicious lesions noted Chest - effort normal Heart - normal rate  Breasts - breasts appear normal, no suspicious masses, no skin or nipple changes or axillary nodes Abdomen - soft, nontender, nondistended, no masses or organomegaly Pelvic - *** VULVA: normal appearing vulva with no masses, tenderness or lesions  VAGINA: normal appearing vagina with normal color and discharge, no lesions  CERVIX: normal appearing cervix without discharge or lesions, no CMT UTERUS: uterus is felt to be normal size, shape, consistency and nontender  ADNEXA: No adnexal  masses or tenderness noted. Extremities:  No swelling or varicosities noted  Chaperone present for exam  No results found for this or any previous visit (from the past 24 hour(s)).  Assessment & Plan:  Diagnoses and all orders for this visit:  Encounter for annual routine gynecological examination  - Cervical cancer screening: Discussed guidelines. Pap with HPV normal 05/2020 - Breast Health: Encouraged self breast awareness/SBE. Discussed limits of clinical breast exam for detecting breast cancer. Discussed importance of annual MXR. Reviewed modifiable risk factors and encouraged her continued efforts.  - Climacteric/Sexual health: Reviewed typical and atypical symptoms of menopause/peri-menopause. Discussed PMB and to call if any amount of spotting. Discussed measure to help with vaginal dryness should they develop.   - Bone Health: Per PCP - Colonoscopy: up to date - F/U 12 months and prn    No orders of the defined types were placed in this encounter.   Meds: No orders of the defined types were placed in this encounter.   Follow-up: No follow-ups on file.  Radene Gunning, MD 07/06/2022 1:21 PM

## 2022-07-07 ENCOUNTER — Other Ambulatory Visit (HOSPITAL_COMMUNITY)
Admission: RE | Admit: 2022-07-07 | Discharge: 2022-07-07 | Disposition: A | Discharge status: Home / Self Care | Payer: PRIVATE HEALTH INSURANCE | Source: Ambulatory Visit | Attending: Obstetrics and Gynecology | Admitting: Obstetrics and Gynecology

## 2022-07-07 ENCOUNTER — Encounter: Payer: Self-pay | Admitting: Obstetrics and Gynecology

## 2022-07-07 ENCOUNTER — Ambulatory Visit (INDEPENDENT_AMBULATORY_CARE_PROVIDER_SITE_OTHER): Payer: PRIVATE HEALTH INSURANCE | Admitting: Obstetrics and Gynecology

## 2022-07-07 VITALS — BP 117/60 | HR 68 | Ht 65.0 in | Wt 138.0 lb

## 2022-07-07 DIAGNOSIS — Z01419 Encounter for gynecological examination (general) (routine) without abnormal findings: Secondary | ICD-10-CM

## 2022-07-09 LAB — CYTOLOGY - PAP
Comment: NEGATIVE
Diagnosis: NEGATIVE
High risk HPV: NEGATIVE

## 2022-12-01 ENCOUNTER — Other Ambulatory Visit: Payer: Self-pay | Admitting: Family Medicine

## 2022-12-01 DIAGNOSIS — Z1231 Encounter for screening mammogram for malignant neoplasm of breast: Secondary | ICD-10-CM

## 2023-01-13 ENCOUNTER — Encounter: Payer: PRIVATE HEALTH INSURANCE | Admitting: Family Medicine

## 2023-01-14 ENCOUNTER — Ambulatory Visit (INDEPENDENT_AMBULATORY_CARE_PROVIDER_SITE_OTHER): Payer: PRIVATE HEALTH INSURANCE | Admitting: Family Medicine

## 2023-01-14 ENCOUNTER — Encounter: Payer: Self-pay | Admitting: Family Medicine

## 2023-01-14 VITALS — BP 125/64 | HR 70 | Ht 65.0 in | Wt 140.0 lb

## 2023-01-14 DIAGNOSIS — Z0000 Encounter for general adult medical examination without abnormal findings: Secondary | ICD-10-CM

## 2023-01-14 DIAGNOSIS — Z23 Encounter for immunization: Secondary | ICD-10-CM

## 2023-01-14 NOTE — Progress Notes (Signed)
Complete physical exam  Patient: Vanessa Chase   DOB: 03-Sep-1965   57 y.o. Female  MRN: [id number]  Subjective:    Chief Complaint  Patient presents with   Annual Exam    Vanessa Chase is a 57 y.o. female who presents today for a complete physical exam. She reports consuming a general diet.  Stay active.   She generally feels well. She reports sleeping well. She does not have additional problems to discuss today.    Most recent fall risk assessment:    01/14/2023    3:22 PM  Fall Risk   Falls in the past year? 0  Number falls in past yr: 0  Injury with Fall? 0  Follow up Falls evaluation completed     Most recent depression screenings:    01/11/2022    2:39 PM 01/05/2021    2:40 PM  PHQ 2/9 Scores  PHQ - 2 Score 0 0        Patient Care Team: Agapito Games, MD as PCP - General Verner Chol, CNM as Referring Physician (Certified Nurse Midwife) Louann Sjogren, DPM as Consulting Physician (Podiatry)   Outpatient Medications Prior to Visit  Medication Sig   Multiple Vitamin (MULTIVITAMIN) tablet Take 1 tablet by mouth daily.   Omega-3 Fatty Acids (FISH OIL) 300 MG CAPS Take by mouth.   sertraline (ZOLOFT) 100 MG tablet Take 1 tablet (100 mg total) by mouth daily.   Facility-Administered Medications Prior to Visit  Medication Dose Route Frequency Provider   NON FORMULARY 60 g  60 g Topical TID PRN Louann Sjogren, DPM    ROS        Objective:     BP 125/64   Pulse 70   Ht 5\' 5"  (1.651 m)   Wt 140 lb (63.5 kg)   LMP 11/11/2015   SpO2 99%   BMI 23.30 kg/m    Physical Exam Constitutional:      Appearance: Normal appearance.  HENT:     Head: Normocephalic and atraumatic.     Right Ear: Tympanic membrane, ear canal and external ear normal.     Left Ear: Tympanic membrane, ear canal and external ear normal.     Nose: Nose normal.     Mouth/Throat:     Pharynx: Oropharynx is clear.  Eyes:     Extraocular Movements: Extraocular  movements intact.     Conjunctiva/sclera: Conjunctivae normal.     Pupils: Pupils are equal, round, and reactive to light.  Neck:     Thyroid: No thyromegaly.  Cardiovascular:     Rate and Rhythm: Normal rate and regular rhythm.  Pulmonary:     Effort: Pulmonary effort is normal.     Breath sounds: Normal breath sounds.  Abdominal:     General: Bowel sounds are normal.     Palpations: Abdomen is soft.     Tenderness: There is no abdominal tenderness.  Musculoskeletal:        General: No swelling.     Cervical back: Neck supple.  Skin:    General: Skin is warm and dry.  Neurological:     Mental Status: She is oriented to person, place, and time.  Psychiatric:        Mood and Affect: Mood normal.        Behavior: Behavior normal.      No results found for any visits on 01/14/23.     Assessment & Plan:    Routine Health Maintenance and  Physical Exam  Immunization History  Administered Date(s) Administered   Influenza, Seasonal, Injecte, Preservative Fre 01/14/2023   Influenza,inj,Quad PF,6+ Mos 01/11/2022   Moderna Sars-Covid-2 Vaccination 07/19/2019, 08/16/2019, 03/17/2020, 10/15/2020   Pfizer Covid-19 Vaccine Bivalent Booster 16yrs & up 01/22/2022   Pfizer(Comirnaty)Fall Seasonal Vaccine 12 years and older 01/14/2023   Tdap 02/20/2010, 06/11/2019   Zoster Recombinant(Shingrix) 11/07/2017, 03/17/2018    Health Maintenance  Topic Date Due   MAMMOGRAM  01/15/2024   Colonoscopy  07/03/2026   Cervical Cancer Screening (HPV/Pap Cotest)  07/07/2027   DTaP/Tdap/Td (3 - Td or Tdap) 06/10/2029   INFLUENZA VACCINE  Completed   COVID-19 Vaccine  Completed   Hepatitis C Screening  Completed   Zoster Vaccines- Shingrix  Completed   Pneumococcal Vaccine 40-49 Years old  Aged Out   HPV VACCINES  Aged Out   HIV Screening  Discontinued    Discussed health benefits of physical activity, and encouraged her to engage in regular exercise appropriate for her age and  condition.  Problem List Items Addressed This Visit   None Visit Diagnoses     Wellness examination    -  Primary   Relevant Orders   CMP14+EGFR   Lipid panel   CBC   Hemoglobin A1c   Encounter for immunization       Relevant Orders   Pfizer Comirnaty Covid-19 Vaccine 60yrs & older (Completed)   Encounter for immunization       Relevant Orders   Flu vaccine trivalent PF, 6mos and older(Flulaval,Afluria,Fluarix,Fluzone) (Completed)     Keep up a regular exercise program and make sure you are eating a healthy diet Try to eat 4 servings of dairy a day, or if you are lactose intolerant take a calcium with vitamin D daily.  Your vaccines are up to date.   Return in about 1 year (around 01/14/2024) for Wellness Exam.     Nani Gasser, MD

## 2023-01-15 LAB — CBC
Hematocrit: 38.4 % (ref 34.0–46.6)
Hemoglobin: 13 g/dL (ref 11.1–15.9)
MCH: 32.1 pg (ref 26.6–33.0)
MCHC: 33.9 g/dL (ref 31.5–35.7)
MCV: 95 fL (ref 79–97)
Platelets: 156 10*3/uL (ref 150–450)
RBC: 4.05 x10E6/uL (ref 3.77–5.28)
RDW: 12.3 % (ref 11.7–15.4)
WBC: 4.1 10*3/uL (ref 3.4–10.8)

## 2023-01-15 LAB — CMP14+EGFR
ALT: 15 IU/L (ref 0–32)
AST: 21 IU/L (ref 0–40)
Albumin: 4.7 g/dL (ref 3.8–4.9)
Alkaline Phosphatase: 93 IU/L (ref 44–121)
BUN/Creatinine Ratio: 31 — ABNORMAL HIGH (ref 9–23)
BUN: 26 mg/dL — ABNORMAL HIGH (ref 6–24)
Bilirubin Total: 0.5 mg/dL (ref 0.0–1.2)
CO2: 28 mmol/L (ref 20–29)
Calcium: 9.6 mg/dL (ref 8.7–10.2)
Chloride: 103 mmol/L (ref 96–106)
Creatinine, Ser: 0.85 mg/dL (ref 0.57–1.00)
Globulin, Total: 1.8 g/dL (ref 1.5–4.5)
Glucose: 70 mg/dL (ref 70–99)
Potassium: 4.3 mmol/L (ref 3.5–5.2)
Sodium: 143 mmol/L (ref 134–144)
Total Protein: 6.5 g/dL (ref 6.0–8.5)
eGFR: 79 mL/min/{1.73_m2} (ref >59)

## 2023-01-15 LAB — LIPID PANEL
Chol/HDL Ratio: 2.5 ratio (ref 0.0–4.4)
Cholesterol, Total: 212 mg/dL — ABNORMAL HIGH (ref 100–199)
HDL: 86 mg/dL (ref >39)
LDL Chol Calc (NIH): 112 mg/dL — ABNORMAL HIGH (ref 0–99)
Triglycerides: 80 mg/dL (ref 0–149)
VLDL Cholesterol Cal: 14 mg/dL (ref 5–40)

## 2023-01-15 LAB — HEMOGLOBIN A1C
Est. average glucose Bld gHb Est-mCnc: 111 mg/dL
Hgb A1c MFr Bld: 5.5 % (ref 4.8–5.6)

## 2023-01-17 NOTE — Progress Notes (Signed)
Hi Vanessa Chase, you do look a little bit dry on your blood work but otherwise it looks okay.  Liver function is normal.  LDL cholesterol is up just a little at 112 goal is less than 100.  Your good cholesterol looks fantastic.  Your blood count is normal no sign of anemia or infection.  A1c is normal no sign of diabetes.

## 2023-01-19 ENCOUNTER — Ambulatory Visit: Payer: PRIVATE HEALTH INSURANCE

## 2023-01-19 DIAGNOSIS — Z1231 Encounter for screening mammogram for malignant neoplasm of breast: Secondary | ICD-10-CM | POA: Diagnosis not present

## 2023-01-21 NOTE — Progress Notes (Signed)
Please call patient. Normal mammogram.  Repeat in 1 year.  

## 2023-01-24 ENCOUNTER — Telehealth: Payer: Self-pay

## 2023-01-24 NOTE — Telephone Encounter (Signed)
Patient came into office to drop off Physical form to be completed by her PCP, forms placed in Dr. Shelah Lewandowsky box to be completed, thanks.

## 2023-01-25 NOTE — Telephone Encounter (Signed)
Pt called and advised that her form is up front ready for p/u.

## 2023-01-25 NOTE — Telephone Encounter (Signed)
Form sstarted and placed in [place] [facility] box

## 2023-02-01 ENCOUNTER — Other Ambulatory Visit: Payer: Self-pay | Admitting: Family Medicine

## 2023-02-01 DIAGNOSIS — F4321 Adjustment disorder with depressed mood: Secondary | ICD-10-CM

## 2023-07-11 ENCOUNTER — Encounter: Payer: Self-pay | Admitting: Obstetrics and Gynecology

## 2023-07-11 ENCOUNTER — Ambulatory Visit (INDEPENDENT_AMBULATORY_CARE_PROVIDER_SITE_OTHER): Payer: PRIVATE HEALTH INSURANCE | Admitting: Obstetrics and Gynecology

## 2023-07-11 VITALS — BP 111/67 | HR 73 | Ht 64.0 in | Wt 133.0 lb

## 2023-07-11 DIAGNOSIS — Z01419 Encounter for gynecological examination (general) (routine) without abnormal findings: Secondary | ICD-10-CM | POA: Diagnosis not present

## 2023-07-11 NOTE — Progress Notes (Signed)
   ANNUAL EXAM Patient name: Vanessa Chase MRN [id number]  Date of birth: 1965-09-08 Chief Complaint:   Annual Exam  History of Present Illness:   Vanessa Chase is a 58 y.o. [phone] female being seen today for a routine annual exam.   Current concerns: None.  Intentional weight loss with diet changes she and spouse have made together.   Patient's last menstrual period was 11/11/2015.   Last MXR: 12/2022 - wnl Last Pap/Pap History:  H/O abnormal pap: no 05/2019. Pap/hpv wnl 06/2022: Pap/hpv wnl  Review of Systems:   Pertinent items are noted in HPI Denies any headaches, blurred vision, fatigue, shortness of breath, chest pain, abdominal pain, abnormal vaginal discharge/itching/odor/irritation, problems with periods, bowel movements, urination, or intercourse unless otherwise stated above.  Pertinent History Reviewed:  Reviewed past medical,surgical, social and family history.  Reviewed problem list, medications and allergies. Physical Assessment:   Vitals:   07/11/23 1440  BP: 111/67  Pulse: 73  Weight: 133 lb (60.3 kg)  Height: 5\' 4"  (1.626 m)   Body mass index is 22.83 kg/m.   Physical Examination:  General appearance - well appearing, and in no distress Mental status - alert, oriented to person, place, and time Psych:  She has a normal mood and affect Skin - warm and dry, normal color, no suspicious lesions noted Chest - effort normal Heart - normal rate  Breasts - breasts appear normal, no suspicious masses, no skin or nipple changes or axillary nodes Abdomen - soft, nontender, nondistended, no masses or organomegaly Pelvic -  VULVA: normal appearing vulva with no masses, tenderness or lesions  VAGINA: normal appearing vagina with normal color and discharge, no lesions  CERVIX: normal appearing cervix without discharge or lesions, no CMT UTERUS: uterus is felt to be normal size, shape, consistency and nontender  ADNEXA: No adnexal masses or tenderness  noted. Extremities:  No swelling or varicosities noted  Chaperone present for exam  No results found for this or any previous visit (from the past 24 hours).  Assessment & Plan:  Huda was seen today for annual exam.  Diagnoses and all orders for this visit:  Encounter for annual routine gynecological examination -     MM 3D SCREENING MAMMOGRAM BILATERAL BREAST; Future  - Cervical cancer screening: Discussed guidelines. Pap with HPV wnl 06/2022 - Breast Health: Encouraged self breast awareness/SBE. Discussed limits of clinical breast exam for detecting breast cancer. Discussed importance of annual MXR. Reviewed modifiable risk factors and encouraged her continued efforts. Due in September.  - Climacteric/Sexual health: Reviewed typical and atypical symptoms of menopause/peri-menopause. Discussed PMB and to call if any amount of spotting. Discussed measure to help with vaginal dryness should they develop.   - Bone Health: Per PCP - Colonoscopy: up to date - F/U 12 months and prn    Orders Placed This Encounter  Procedures   MM 3D SCREENING MAMMOGRAM BILATERAL BREAST    Meds: No orders of the defined types were placed in this encounter.   Follow-up: Return in about 1 year (around 07/10/2024) for annual.  Milas Hock, MD 07/11/2023 3:08 PM

## 2023-10-19 ENCOUNTER — Encounter: Payer: Self-pay | Admitting: Family Medicine

## 2023-10-21 NOTE — Telephone Encounter (Signed)
 Bascom can you check on this in epic and in CIR and let her know?

## 2023-10-28 ENCOUNTER — Other Ambulatory Visit: Payer: Self-pay | Admitting: Family Medicine

## 2023-10-28 DIAGNOSIS — F4320 Adjustment disorder, unspecified: Secondary | ICD-10-CM

## 2023-12-27 ENCOUNTER — Encounter: Payer: Self-pay | Admitting: Sports Medicine

## 2024-01-16 ENCOUNTER — Ambulatory Visit (INDEPENDENT_AMBULATORY_CARE_PROVIDER_SITE_OTHER): Payer: PRIVATE HEALTH INSURANCE | Admitting: Family Medicine

## 2024-01-16 ENCOUNTER — Encounter: Payer: Self-pay | Admitting: Family Medicine

## 2024-01-16 VITALS — BP 118/63 | HR 79 | Ht 64.0 in | Wt 136.0 lb

## 2024-01-16 DIAGNOSIS — M71349 Other bursal cyst, unspecified hand: Secondary | ICD-10-CM

## 2024-01-16 DIAGNOSIS — Z23 Encounter for immunization: Secondary | ICD-10-CM

## 2024-01-16 DIAGNOSIS — Z0000 Encounter for general adult medical examination without abnormal findings: Secondary | ICD-10-CM

## 2024-01-16 NOTE — Progress Notes (Signed)
 Complete physical exam  Patient: Vanessa Chase   DOB: 15-Nov-1965   58 y.o. Female  MRN: [id number]  Subjective:    Chief Complaint  Patient presents with   Annual Exam    Vanessa Chase is a 58 y.o. female who presents today for a complete physical exam. She reports consuming a general diet. Stays active. Works with kids She generally feels well. She reports sleeping well. She does have additional problems to discuss today.   She has noticed a knot on the PIP joint on her fourth finger on her left hand she says in the morning she can usually get a ring on but by the time she gets home she has significant difficulty getting it off and then it rubs a knot that she has there and actually tears the skin  Most recent fall risk assessment:    01/14/2023    3:22 PM  Fall Risk   Falls in the past year? 0  Number falls in past yr: 0  Injury with Fall? 0  Follow up Falls evaluation completed     Most recent depression screenings:    01/16/2024    3:19 PM 01/14/2023    3:38 PM  PHQ 2/9 Scores  PHQ - 2 Score 0 0  PHQ- 9 Score  0         Patient Care Team: Alvan Dorothyann BIRCH, MD as PCP - General Rodgers Barnie RAMAN, CNM as Referring Physician (Certified Nurse Midwife) Joya Stabs, DPM as Consulting Physician (Podiatry)   Outpatient Medications Prior to Visit  Medication Sig   Multiple Vitamin (MULTIVITAMIN) tablet Take 1 tablet by mouth daily.   Omega-3 Fatty Acids (FISH OIL) 300 MG CAPS Take by mouth.   Saccharomyces boulardii (PROBIOTIC) 250 MG CAPS Take by mouth.   sertraline  (ZOLOFT ) 100 MG tablet Take 1 tablet (100 mg total) by mouth daily.   Facility-Administered Medications Prior to Visit  Medication Dose Route Frequency Provider   NON FORMULARY 60 g  60 g Topical TID PRN Joya Stabs, DPM    ROS        Objective:     BP 118/63   Pulse 79   Ht 5' 4 (1.626 m)   Wt 136 lb 0.6 oz (61.7 kg)   LMP 11/11/2015   SpO2 100%   BMI 23.35 kg/m      Physical Exam Constitutional:      Appearance: Normal appearance.  HENT:     Head: Normocephalic and atraumatic.     Right Ear: Tympanic membrane, ear canal and external ear normal.     Left Ear: Tympanic membrane, ear canal and external ear normal.     Nose: Nose normal.     Mouth/Throat:     Pharynx: Oropharynx is clear.  Eyes:     Extraocular Movements: Extraocular movements intact.     Conjunctiva/sclera: Conjunctivae normal.     Pupils: Pupils are equal, round, and reactive to light.  Neck:     Thyroid: No thyromegaly.  Cardiovascular:     Rate and Rhythm: Normal rate and regular rhythm.  Pulmonary:     Effort: Pulmonary effort is normal.     Breath sounds: Normal breath sounds.  Abdominal:     General: Bowel sounds are normal.     Palpations: Abdomen is soft.     Tenderness: There is no abdominal tenderness.  Musculoskeletal:        General: No swelling.     Cervical back: Neck supple.  Skin:    General: Skin is warm and dry.  Neurological:     Mental Status: She is oriented to person, place, and time.  Psychiatric:        Mood and Affect: Mood normal.        Behavior: Behavior normal.      No results found for any visits on 01/16/24.      Assessment & Plan:    Routine Health Maintenance and Physical Exam  Immunization History  Administered Date(s) Administered   Influenza, Seasonal, Injecte, Preservative Fre 01/14/2023, 01/16/2024   Influenza,inj,Quad PF,6+ Mos 01/11/2022   Moderna Sars-Covid-2 Vaccination 07/19/2019, 08/16/2019, 03/17/2020, 10/15/2020   PNEUMOCOCCAL CONJUGATE-20 01/16/2024   Pfizer Covid-19 Vaccine Bivalent Booster 17yrs & up 01/22/2022   Pfizer(Comirnaty)Fall Seasonal Vaccine 12 years and older 01/14/2023, 01/16/2024   Tdap 02/20/2010, 06/11/2019   Zoster Recombinant(Shingrix ) 11/07/2017, 03/17/2018    Health Maintenance  Topic Date Due   Hepatitis B Vaccines 19-59 Average Risk (1 of 3 - 19+ 3-dose series) Never done    Mammogram  01/18/2025   Colonoscopy  07/03/2026   Cervical Cancer Screening (HPV/Pap Cotest)  07/07/2027   DTaP/Tdap/Td (3 - Td or Tdap) 06/10/2029   Pneumococcal Vaccine: 50+ Years  Completed   Influenza Vaccine  Completed   COVID-19 Vaccine  Completed   Hepatitis C Screening  Completed   Zoster Vaccines- Shingrix   Completed   HPV VACCINES  Aged Out   Meningococcal B Vaccine  Aged Out   HIV Screening  Discontinued    Discussed health benefits of physical activity, and encouraged her to engage in regular exercise appropriate for her age and condition.  Problem List Items Addressed This Visit   None Visit Diagnoses       Wellness examination    -  Primary   Relevant Orders   CMP14+EGFR   Lipid Panel With LDL/HDL Ratio   HgB A1c   CBC     Encounter for immunization       Relevant Orders   Flu vaccine trivalent PF, 6mos and older(Flulaval,Afluria,Fluarix,Fluzone) (Completed)   Pneumococcal conjugate vaccine 20-valent (Completed)   Pfizer Comirnaty Covid-19 Vaccine 102yrs & older (Completed)     Synovial cyst of hand       Relevant Orders   Ambulatory referral to Sports Medicine       Synovial cyst on 4th finger of left hand.  Placed referral to sports med for treatment she would like to have it treated as this is her ring finger and would like to be able to wear her ring on that finger.  We did discuss the benign nature of this lesion.  Physical performed today.  Given her flu shot, COVID-vaccine and Prevnar 20.  She is up-to-date on her shingles vaccine. Will get updated lab work today.   Return in about 1 year (around 01/15/2025) for Wellness Exam.     Dorothyann Byars, MD

## 2024-01-17 ENCOUNTER — Ambulatory Visit: Payer: Self-pay | Admitting: Family Medicine

## 2024-01-17 LAB — HEMOGLOBIN A1C
Est. average glucose Bld gHb Est-mCnc: 114 mg/dL
Hgb A1c MFr Bld: 5.6 % (ref 4.8–5.6)

## 2024-01-17 LAB — CMP14+EGFR
ALT: 17 IU/L (ref 0–32)
AST: 23 IU/L (ref 0–40)
Albumin: 4.7 g/dL (ref 3.8–4.9)
Alkaline Phosphatase: 96 IU/L (ref 49–135)
BUN/Creatinine Ratio: 35 — ABNORMAL HIGH (ref 9–23)
BUN: 26 mg/dL — ABNORMAL HIGH (ref 6–24)
Bilirubin Total: 0.5 mg/dL (ref 0.0–1.2)
CO2: 24 mmol/L (ref 20–29)
Calcium: 9.8 mg/dL (ref 8.7–10.2)
Chloride: 100 mmol/L (ref 96–106)
Creatinine, Ser: 0.74 mg/dL (ref 0.57–1.00)
Globulin, Total: 1.9 g/dL (ref 1.5–4.5)
Glucose: 104 mg/dL — ABNORMAL HIGH (ref 70–99)
Potassium: 4 mmol/L (ref 3.5–5.2)
Sodium: 140 mmol/L (ref 134–144)
Total Protein: 6.6 g/dL (ref 6.0–8.5)
eGFR: 93 mL/min/1.73 (ref >59)

## 2024-01-17 LAB — CBC
Hematocrit: 40.3 % (ref 34.0–46.6)
Hemoglobin: 13.2 g/dL (ref 11.1–15.9)
MCH: 31.2 pg (ref 26.6–33.0)
MCHC: 32.8 g/dL (ref 31.5–35.7)
MCV: 95 fL (ref 79–97)
Platelets: 152 x10E3/uL (ref 150–450)
RBC: 4.23 x10E6/uL (ref 3.77–5.28)
RDW: 12 % (ref 11.7–15.4)
WBC: 4.6 x10E3/uL (ref 3.4–10.8)

## 2024-01-17 LAB — LIPID PANEL WITH LDL/HDL RATIO
Cholesterol, Total: 242 mg/dL — ABNORMAL HIGH (ref 100–199)
HDL: 90 mg/dL (ref >39)
LDL Chol Calc (NIH): 143 mg/dL — ABNORMAL HIGH (ref 0–99)
LDL/HDL Ratio: 1.6 ratio (ref 0.0–3.2)
Triglycerides: 53 mg/dL (ref 0–149)
VLDL Cholesterol Cal: 9 mg/dL (ref 5–40)

## 2024-01-17 NOTE — Progress Notes (Signed)
 Hi Vanessa Chase, kidney function is stable.  You still look a little dry on your blood work so continue to try to get that water intake in.  Liver function looks great.  Total cholesterol and LDL did trend upward compared to last year.  Just encourage you to continue to work on Mediterranean style diet with at least 30 minutes of moderate exercise/activity for 5 days a week.  This will help reduce those numbers.  A1c in the normal range.  Blood counts normal no sign of anemia or infection.  The 10-year ASCVD risk score (Arnett DK, et al., 2019) is: 2.1%   Values used to calculate the score:     Age: 58 years     Clincally relevant sex: Female     Is Non-Hispanic African American: No     Diabetic: No     Tobacco smoker: No     Systolic Blood Pressure: 118 mmHg     Is BP treated: No     HDL Cholesterol: 90 mg/dL     Total Cholesterol: 242 mg/dL

## 2024-01-20 ENCOUNTER — Other Ambulatory Visit: Payer: Self-pay | Admitting: Medical Genetics

## 2024-01-23 ENCOUNTER — Encounter: Payer: Self-pay | Admitting: Family Medicine

## 2024-01-25 ENCOUNTER — Ambulatory Visit: Payer: PRIVATE HEALTH INSURANCE

## 2024-01-26 ENCOUNTER — Ambulatory Visit: Payer: Self-pay

## 2024-01-26 ENCOUNTER — Ambulatory Visit: Payer: PRIVATE HEALTH INSURANCE

## 2024-01-26 ENCOUNTER — Ambulatory Visit (INDEPENDENT_AMBULATORY_CARE_PROVIDER_SITE_OTHER): Payer: PRIVATE HEALTH INSURANCE

## 2024-01-26 VITALS — BP 110/60 | Ht 65.0 in | Wt 135.0 lb

## 2024-01-26 DIAGNOSIS — Z1231 Encounter for screening mammogram for malignant neoplasm of breast: Secondary | ICD-10-CM | POA: Diagnosis not present

## 2024-01-26 DIAGNOSIS — M79642 Pain in left hand: Secondary | ICD-10-CM

## 2024-01-26 DIAGNOSIS — Z01419 Encounter for gynecological examination (general) (routine) without abnormal findings: Secondary | ICD-10-CM

## 2024-01-26 NOTE — Progress Notes (Signed)
   Subjective:    Patient ID: Vanessa Chase, female    DOB: 58 y.o., 25-Jul-1965   MRN: [id number]  HPI  Chief Complaint: Left hand fourth finger cyst  Reports approximately 2 weeks ago a small swelling appeared on the dorsal aspect of the PIP joint of her ring finger on her left hand. 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 No numbness or tingling in the distal finger. No overlying skin changes that she can perceive     Objective:   Physical Exam Vitals:   01/26/24 1034  BP: 110/60    Left hand: The digits of the left hand all appear normal without bony deformity or swelling with the exception of the PIP joint of the fourth digit which on its dorsal aspect has a focal area of fluctuance which is freely mobile and not firm.  Mild increased erythema over the area.  No significant increase in warmth.  Patient does note to me that she has irritated the area by taking her ring on and off (which has been difficult).    Limited ultrasound exam of the left hand: The dorsal aspect of the PIP joint of the fourth digit has a hypoechogenic fluid focus in the soft tissue above the extensor tendon and is just proximal to the PIP joint and appears to have a hypoechogenic tract connecting it with the PIP joint.  No Doppler uptake within the lesion. Impression: Digital ganglion/myxoid cyst of the PIP joint of the fourth digit of the left hand  Assessment & Plan:   Vanessa Chase is a 58 y.o. female presenting with a swelling of the fourth digit of her dominant left hand consistent with a ganglion cyst based on my exam and ultrasound.  We discussed watchful waiting versus aspiration versus aspiration and  injection versus referral for surgical excision.  Ultimately she decided to pursue watchful waiting and we will follow-up in 8 weeks to reassess given that it has been approximately 2 weeks since this started and many of these lesions resolve spontaneously by 12 weeks.

## 2024-01-31 ENCOUNTER — Ambulatory Visit: Payer: Self-pay | Admitting: Family Medicine

## 2024-01-31 NOTE — Progress Notes (Signed)
 Hi Vanessa Chase, mammogram shows a questionable area in the left breast so the imaging department will be contacting you to schedule a diagnostic mammogram as well as possibly an ultrasound.

## 2024-02-01 ENCOUNTER — Other Ambulatory Visit: Payer: Self-pay | Admitting: Obstetrics and Gynecology

## 2024-02-01 DIAGNOSIS — R928 Other abnormal and inconclusive findings on diagnostic imaging of breast: Secondary | ICD-10-CM

## 2024-02-08 ENCOUNTER — Ambulatory Visit
Admission: RE | Admit: 2024-02-08 | Discharge: 2024-02-08 | Disposition: A | Discharge status: Home / Self Care | Payer: PRIVATE HEALTH INSURANCE | Source: Ambulatory Visit | Attending: Obstetrics and Gynecology | Admitting: Obstetrics and Gynecology

## 2024-02-08 ENCOUNTER — Ambulatory Visit: Payer: PRIVATE HEALTH INSURANCE

## 2024-02-08 DIAGNOSIS — R928 Other abnormal and inconclusive findings on diagnostic imaging of breast: Secondary | ICD-10-CM

## 2024-03-21 ENCOUNTER — Ambulatory Visit: Payer: PRIVATE HEALTH INSURANCE

## 2024-03-21 ENCOUNTER — Other Ambulatory Visit (INDEPENDENT_AMBULATORY_CARE_PROVIDER_SITE_OTHER): Payer: PRIVATE HEALTH INSURANCE

## 2024-03-21 VITALS — BP 110/70 | Ht 65.0 in | Wt 135.0 lb

## 2024-03-21 DIAGNOSIS — M67442 Ganglion, left hand: Secondary | ICD-10-CM

## 2024-03-21 MED ORDER — METHYLPREDNISOLONE ACETATE 40 MG/ML IJ SUSP
40.0000 mg | Freq: Once | INTRAMUSCULAR | Status: AC
  Administered 2024-03-21: 20 mg via INTRA_ARTICULAR

## 2024-03-21 NOTE — Progress Notes (Signed)
   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 Interested in aspiration/injection today     Objective:   There were no vitals filed for this visit.  Left hand: Vanessa Chase is a left ring finger digital mucous/ganglion cyst is unchanged from prior exam  Left 4th digit PIP joint Digital Mucoid cyst Aspiration/injection with Ultrasound Guidance Vanessa Chase May 24, 1965 Indications: Pain Procedure Details Following the description of risks including infection bleeding, damage to surrounding structures, atrophy, and hypo-pigmentation, patient provided written consent for left 4th digit pip ganglion cyst aspiration/injection with ultrasound guidance. Ultrasound was used to visualize the ganglion cyst. Patient was sterilely prepped in the usual fashion with alcohol.  Following topical anesthetization with ethyl chloride, patient was anesthetized with 2cc Lidocaine 2% buffered with 0.25cc Sodium Bicarbonate 8.4%. After allowing for anesthetization, aspiration was performed with 18 g needle on a 5 ml syringe with clear gel removed. Following this, syringes were exchanged and the joint was injected with 20mg  dep-medrol . Patient tolerated well without complication. This was well visualized under ultrasound, please see associated photographic documentation. Precautions provided. Cleaned and dressing applied.   Assessment & Plan:   Assessment & Plan After discussion of the risks and benefits of aspiration including the likelihood of  recurrence, Vanessa Chase decided to proceed with a ganglion cyst aspiration with small dose of steroid injection to follow.  Provided postinjection instructions as well as return precautions.  If recurs, will likely refer to hand surgery for excision.

## 2024-04-16 ENCOUNTER — Other Ambulatory Visit (HOSPITAL_COMMUNITY)
Admission: RE | Admit: 2024-04-16 | Discharge: 2024-04-16 | Disposition: A | Discharge status: Home / Self Care | Payer: Self-pay | Source: Ambulatory Visit | Attending: Medical Genetics | Admitting: Medical Genetics

## 2024-05-04 LAB — GENECONNECT MOLECULAR SCREEN: Genetic Analysis Overall Interpretation: NEGATIVE

## 2024-07-12 ENCOUNTER — Ambulatory Visit: Payer: PRIVATE HEALTH INSURANCE | Admitting: Obstetrics and Gynecology

## 2025-01-17 ENCOUNTER — Encounter: Payer: PRIVATE HEALTH INSURANCE | Admitting: Family Medicine
